# Patient Record
Sex: Female | Born: 1990 | Race: Black or African American | Hispanic: No | Marital: Married | Smoking: Current every day smoker
Health system: Southern US, Community
[De-identification: ages and names within clinical notes are randomized; demographics above are authoritative.]

## PROBLEM LIST (undated history)

## (undated) DIAGNOSIS — J45909 Unspecified asthma, uncomplicated: Secondary | ICD-10-CM

## (undated) DIAGNOSIS — D649 Anemia, unspecified: Secondary | ICD-10-CM

## (undated) DIAGNOSIS — E119 Type 2 diabetes mellitus without complications: Secondary | ICD-10-CM

## (undated) HISTORY — DX: Unspecified asthma, uncomplicated: J45.909

## (undated) HISTORY — PX: NO PAST SURGERIES: SHX2092

## (undated) HISTORY — DX: Anemia, unspecified: D64.9

---

## 2012-10-28 ENCOUNTER — Emergency Department: Payer: Self-pay | Admitting: Emergency Medicine

## 2013-01-07 ENCOUNTER — Emergency Department: Payer: Self-pay | Admitting: Emergency Medicine

## 2013-01-07 LAB — CBC
HCT: 28 % — ABNORMAL LOW (ref 35.0–47.0)
HGB: 8.8 g/dL — ABNORMAL LOW (ref 12.0–16.0)
MCH: 20 pg — ABNORMAL LOW (ref 26.0–34.0)
MCV: 64 fL — ABNORMAL LOW (ref 80–100)
Platelet: 493 10*3/uL — ABNORMAL HIGH (ref 150–440)
RBC: 4.4 10*6/uL (ref 3.80–5.20)
RDW: 21.3 % — ABNORMAL HIGH (ref 11.5–14.5)

## 2013-01-07 LAB — URINALYSIS, COMPLETE
Glucose,UR: NEGATIVE mg/dL (ref 0–75)
Hyaline Cast: 2
Ketone: NEGATIVE
Leukocyte Esterase: NEGATIVE
Nitrite: NEGATIVE
RBC,UR: <1 /HPF (ref 0–5)
Specific Gravity: 1.02 (ref 1.003–1.030)

## 2013-01-07 LAB — COMPREHENSIVE METABOLIC PANEL
BUN: 10 mg/dL (ref 7–18)
Calcium, Total: 9.1 mg/dL (ref 8.5–10.1)
Chloride: 103 mmol/L (ref 98–107)
Co2: 28 mmol/L (ref 21–32)
EGFR (African American): >60
EGFR (Non-African Amer.): >60
Osmolality: 270 (ref 275–301)
Potassium: 3.9 mmol/L (ref 3.5–5.1)
SGOT(AST): 23 U/L (ref 15–37)
SGPT (ALT): 22 U/L (ref 12–78)

## 2013-01-14 ENCOUNTER — Emergency Department: Payer: Self-pay | Admitting: Emergency Medicine

## 2013-01-22 ENCOUNTER — Emergency Department: Payer: Self-pay | Admitting: Internal Medicine

## 2013-02-19 ENCOUNTER — Emergency Department: Payer: Self-pay | Admitting: Emergency Medicine

## 2013-02-19 LAB — URINALYSIS, COMPLETE
Bacteria: NONE SEEN
Ketone: NEGATIVE
Leukocyte Esterase: NEGATIVE
Nitrite: NEGATIVE
Ph: 7 (ref 4.5–8.0)
Specific Gravity: 1.011 (ref 1.003–1.030)
WBC UR: <1 /HPF (ref 0–5)

## 2013-02-19 LAB — TSH: Thyroid Stimulating Horm: 3.36 u[IU]/mL

## 2013-02-19 LAB — CBC
HGB: 9 g/dL — ABNORMAL LOW (ref 12.0–16.0)
Platelet: 480 10*3/uL — ABNORMAL HIGH (ref 150–440)
WBC: 6 10*3/uL (ref 3.6–11.0)

## 2013-05-15 ENCOUNTER — Emergency Department: Payer: Self-pay | Admitting: Emergency Medicine

## 2013-09-25 ENCOUNTER — Emergency Department: Payer: Self-pay | Admitting: Emergency Medicine

## 2013-09-25 LAB — URINALYSIS, COMPLETE
Bilirubin,UR: NEGATIVE
GLUCOSE, UR: NEGATIVE mg/dL (ref 0–75)
Ketone: NEGATIVE
LEUKOCYTE ESTERASE: NEGATIVE
Nitrite: NEGATIVE
PH: 5 (ref 4.5–8.0)
Protein: NEGATIVE
SPECIFIC GRAVITY: 1.015 (ref 1.003–1.030)

## 2013-09-25 LAB — COMPREHENSIVE METABOLIC PANEL
ALBUMIN: 3.3 g/dL — AB (ref 3.4–5.0)
AST: 21 U/L (ref 15–37)
Alkaline Phosphatase: 95 U/L
Anion Gap: 3 — ABNORMAL LOW (ref 7–16)
BILIRUBIN TOTAL: 0.2 mg/dL (ref 0.2–1.0)
BUN: 7 mg/dL (ref 7–18)
Calcium, Total: 8.8 mg/dL (ref 8.5–10.1)
Chloride: 107 mmol/L (ref 98–107)
Co2: 29 mmol/L (ref 21–32)
Creatinine: 0.57 mg/dL — ABNORMAL LOW (ref 0.60–1.30)
EGFR (African American): >60
Glucose: 106 mg/dL — ABNORMAL HIGH (ref 65–99)
OSMOLALITY: 276 (ref 275–301)
Potassium: 3.8 mmol/L (ref 3.5–5.1)
SGPT (ALT): 23 U/L (ref 12–78)
Sodium: 139 mmol/L (ref 136–145)
Total Protein: 7.9 g/dL (ref 6.4–8.2)

## 2013-09-25 LAB — CBC WITH DIFFERENTIAL/PLATELET
BASOS ABS: 0 10*3/uL (ref 0.0–0.1)
Basophil %: 0.6 %
Eosinophil #: 0.7 10*3/uL (ref 0.0–0.7)
Eosinophil %: 11.5 %
HCT: 30.1 % — AB (ref 35.0–47.0)
HGB: 9.2 g/dL — AB (ref 12.0–16.0)
LYMPHS ABS: 1.7 10*3/uL (ref 1.0–3.6)
Lymphocyte %: 29 %
MCH: 20.3 pg — AB (ref 26.0–34.0)
MCHC: 30.8 g/dL — AB (ref 32.0–36.0)
MCV: 66 fL — AB (ref 80–100)
MONO ABS: 0.5 x10 3/mm (ref 0.2–0.9)
MONOS PCT: 8.4 %
NEUTROS ABS: 3 10*3/uL (ref 1.4–6.5)
Neutrophil %: 50.5 %
PLATELETS: 476 10*3/uL — AB (ref 150–440)
RBC: 4.54 10*6/uL (ref 3.80–5.20)
RDW: 20 % — ABNORMAL HIGH (ref 11.5–14.5)
WBC: 5.9 10*3/uL (ref 3.6–11.0)

## 2013-11-09 ENCOUNTER — Emergency Department: Payer: Self-pay | Admitting: Emergency Medicine

## 2013-11-09 LAB — CBC WITH DIFFERENTIAL/PLATELET
BASOS PCT: 0.7 %
Basophil #: 0.1 10*3/uL (ref 0.0–0.1)
EOS PCT: 7.1 %
Eosinophil #: 0.6 10*3/uL (ref 0.0–0.7)
HCT: 30.8 % — ABNORMAL LOW (ref 35.0–47.0)
HGB: 9.4 g/dL — ABNORMAL LOW (ref 12.0–16.0)
Lymphocyte #: 1.9 10*3/uL (ref 1.0–3.6)
Lymphocyte %: 22.3 %
MCH: 20 pg — ABNORMAL LOW (ref 26.0–34.0)
MCHC: 30.4 g/dL — ABNORMAL LOW (ref 32.0–36.0)
MCV: 66 fL — ABNORMAL LOW (ref 80–100)
MONOS PCT: 5.7 %
Monocyte #: 0.5 x10 3/mm (ref 0.2–0.9)
NEUTROS ABS: 5.5 10*3/uL (ref 1.4–6.5)
NEUTROS PCT: 64.2 %
Platelet: 471 10*3/uL — ABNORMAL HIGH (ref 150–440)
RBC: 4.66 10*6/uL (ref 3.80–5.20)
RDW: 21.1 % — AB (ref 11.5–14.5)
WBC: 8.5 10*3/uL (ref 3.6–11.0)

## 2013-11-09 LAB — BASIC METABOLIC PANEL
Anion Gap: 7 (ref 7–16)
BUN: 13 mg/dL (ref 7–18)
CALCIUM: 9.4 mg/dL (ref 8.5–10.1)
CHLORIDE: 105 mmol/L (ref 98–107)
CO2: 27 mmol/L (ref 21–32)
Creatinine: 0.88 mg/dL (ref 0.60–1.30)
EGFR (Non-African Amer.): >60
Glucose: 119 mg/dL — ABNORMAL HIGH (ref 65–99)
Osmolality: 279 (ref 275–301)
POTASSIUM: 4 mmol/L (ref 3.5–5.1)
Sodium: 139 mmol/L (ref 136–145)

## 2013-11-09 LAB — URINALYSIS, COMPLETE
BILIRUBIN, UR: NEGATIVE
Bacteria: NONE SEEN
Blood: NEGATIVE
Glucose,UR: NEGATIVE mg/dL (ref 0–75)
Hyaline Cast: 4
Ketone: NEGATIVE
LEUKOCYTE ESTERASE: NEGATIVE
NITRITE: NEGATIVE
PH: 6 (ref 4.5–8.0)
PROTEIN: NEGATIVE
RBC,UR: 1 /HPF (ref 0–5)
SPECIFIC GRAVITY: 1.029 (ref 1.003–1.030)
Squamous Epithelial: 2

## 2014-05-05 ENCOUNTER — Emergency Department: Payer: Self-pay | Admitting: Student

## 2014-05-05 LAB — COMPREHENSIVE METABOLIC PANEL
ALBUMIN: 3.1 g/dL — AB (ref 3.4–5.0)
Alkaline Phosphatase: 97 U/L
Anion Gap: 9 (ref 7–16)
BILIRUBIN TOTAL: 0.2 mg/dL (ref 0.2–1.0)
BUN: 8 mg/dL (ref 7–18)
CO2: 25 mmol/L (ref 21–32)
CREATININE: 0.83 mg/dL (ref 0.60–1.30)
Calcium, Total: 8.4 mg/dL — ABNORMAL LOW (ref 8.5–10.1)
Chloride: 106 mmol/L (ref 98–107)
EGFR (African American): >60
EGFR (Non-African Amer.): >60
Glucose: 117 mg/dL — ABNORMAL HIGH (ref 65–99)
Osmolality: 279 (ref 275–301)
Potassium: 4 mmol/L (ref 3.5–5.1)
SGOT(AST): 27 U/L (ref 15–37)
SGPT (ALT): 28 U/L
Sodium: 140 mmol/L (ref 136–145)
Total Protein: 7.6 g/dL (ref 6.4–8.2)

## 2014-05-05 LAB — CBC
HCT: 31.2 % — ABNORMAL LOW (ref 35.0–47.0)
HGB: 9.8 g/dL — ABNORMAL LOW (ref 12.0–16.0)
MCH: 21.6 pg — ABNORMAL LOW (ref 26.0–34.0)
MCHC: 31.3 g/dL — ABNORMAL LOW (ref 32.0–36.0)
MCV: 69 fL — AB (ref 80–100)
Platelet: 455 10*3/uL — ABNORMAL HIGH (ref 150–440)
RBC: 4.52 10*6/uL (ref 3.80–5.20)
RDW: 20.4 % — ABNORMAL HIGH (ref 11.5–14.5)
WBC: 8 10*3/uL (ref 3.6–11.0)

## 2014-05-05 LAB — TROPONIN I: Troponin-I: <0.02 ng/mL

## 2014-05-05 LAB — LIPASE, BLOOD: Lipase: 78 U/L (ref 73–393)

## 2014-05-05 LAB — HCG, QUANTITATIVE, PREGNANCY: Beta Hcg, Quant.: <1 m[IU]/mL — ABNORMAL LOW
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
## 2019-01-25 ENCOUNTER — Other Ambulatory Visit: Payer: Self-pay

## 2019-01-25 DIAGNOSIS — Z20822 Contact with and (suspected) exposure to covid-19: Secondary | ICD-10-CM

## 2019-01-26 LAB — NOVEL CORONAVIRUS, NAA: SARS-CoV-2, NAA: NOT DETECTED

## 2019-07-10 ENCOUNTER — Other Ambulatory Visit: Payer: Self-pay

## 2020-02-29 ENCOUNTER — Other Ambulatory Visit: Payer: Self-pay

## 2020-02-29 DIAGNOSIS — R112 Nausea with vomiting, unspecified: Secondary | ICD-10-CM | POA: Insufficient documentation

## 2020-02-29 DIAGNOSIS — R197 Diarrhea, unspecified: Secondary | ICD-10-CM | POA: Insufficient documentation

## 2020-02-29 DIAGNOSIS — R42 Dizziness and giddiness: Secondary | ICD-10-CM | POA: Insufficient documentation

## 2020-02-29 DIAGNOSIS — Z5321 Procedure and treatment not carried out due to patient leaving prior to being seen by health care provider: Secondary | ICD-10-CM | POA: Insufficient documentation

## 2020-02-29 LAB — CBC
HCT: 35.7 % — ABNORMAL LOW (ref 36.0–46.0)
Hemoglobin: 10.2 g/dL — ABNORMAL LOW (ref 12.0–15.0)
MCH: 20.4 pg — ABNORMAL LOW (ref 26.0–34.0)
MCHC: 28.6 g/dL — ABNORMAL LOW (ref 30.0–36.0)
MCV: 71.5 fL — ABNORMAL LOW (ref 80.0–100.0)
Platelets: 307 10*3/uL (ref 150–400)
RBC: 4.99 MIL/uL (ref 3.87–5.11)
RDW: 21.2 % — ABNORMAL HIGH (ref 11.5–15.5)
WBC: 8.2 10*3/uL (ref 4.0–10.5)
nRBC: 0 % (ref 0.0–0.2)

## 2020-02-29 LAB — COMPREHENSIVE METABOLIC PANEL
ALT: 24 U/L (ref 0–44)
AST: 19 U/L (ref 15–41)
Albumin: 3.6 g/dL (ref 3.5–5.0)
Alkaline Phosphatase: 69 U/L (ref 38–126)
Anion gap: 11 (ref 5–15)
BUN: 7 mg/dL (ref 6–20)
CO2: 25 mmol/L (ref 22–32)
Calcium: 8.5 mg/dL — ABNORMAL LOW (ref 8.9–10.3)
Chloride: 98 mmol/L (ref 98–111)
Creatinine, Ser: 0.6 mg/dL (ref 0.44–1.00)
GFR, Estimated: >60 mL/min (ref >60)
Glucose, Bld: 389 mg/dL — ABNORMAL HIGH (ref 70–99)
Potassium: 3.8 mmol/L (ref 3.5–5.1)
Sodium: 134 mmol/L — ABNORMAL LOW (ref 135–145)
Total Bilirubin: 0.3 mg/dL (ref 0.3–1.2)
Total Protein: 7.4 g/dL (ref 6.5–8.1)

## 2020-02-29 LAB — TROPONIN I (HIGH SENSITIVITY): Troponin I (High Sensitivity): 6 ng/L

## 2020-02-29 LAB — URINALYSIS, COMPLETE (UACMP) WITH MICROSCOPIC
Bacteria, UA: NONE SEEN
Bilirubin Urine: NEGATIVE
Glucose, UA: >=500 mg/dL — AB
Hgb urine dipstick: NEGATIVE
Ketones, ur: NEGATIVE mg/dL
Leukocytes,Ua: NEGATIVE
Nitrite: NEGATIVE
Protein, ur: NEGATIVE mg/dL
Specific Gravity, Urine: 1.032 — ABNORMAL HIGH (ref 1.005–1.030)
pH: 6 (ref 5.0–8.0)

## 2020-02-29 LAB — POCT PREGNANCY, URINE: Preg Test, Ur: NEGATIVE

## 2020-02-29 NOTE — ED Triage Notes (Signed)
Pt in with co n.v.d since yesterday. Vomited x 2 today with 2 episodes of diarrhea. States today was at work and started feeling dizzy and weak.

## 2020-03-01 ENCOUNTER — Emergency Department
Admission: EM | Admit: 2020-03-01 | Discharge: 2020-03-01 | Disposition: A | Discharge status: Home / Self Care | Payer: Self-pay | Attending: Emergency Medicine | Admitting: Emergency Medicine

## 2020-03-01 ENCOUNTER — Telehealth: Payer: Self-pay | Admitting: Emergency Medicine

## 2020-03-01 NOTE — Telephone Encounter (Signed)
Called patient due to lwot to inquire about condition and follow up plans. Left message.   

## 2020-03-01 NOTE — ED Notes (Signed)
No answer when called 

## 2020-03-01 NOTE — ED Notes (Signed)
No answer when called from lobby 

## 2020-10-03 ENCOUNTER — Other Ambulatory Visit: Payer: Self-pay

## 2020-10-03 ENCOUNTER — Emergency Department
Admission: EM | Admit: 2020-10-03 | Discharge: 2020-10-03 | Disposition: A | Discharge status: Home / Self Care | Payer: No Typology Code available for payment source | Attending: Emergency Medicine | Admitting: Emergency Medicine

## 2020-10-03 ENCOUNTER — Emergency Department: Payer: No Typology Code available for payment source

## 2020-10-03 DIAGNOSIS — Y92410 Unspecified street and highway as the place of occurrence of the external cause: Secondary | ICD-10-CM | POA: Insufficient documentation

## 2020-10-03 DIAGNOSIS — S40021A Contusion of right upper arm, initial encounter: Secondary | ICD-10-CM | POA: Diagnosis not present

## 2020-10-03 DIAGNOSIS — F17200 Nicotine dependence, unspecified, uncomplicated: Secondary | ICD-10-CM | POA: Diagnosis not present

## 2020-10-03 DIAGNOSIS — S61411A Laceration without foreign body of right hand, initial encounter: Secondary | ICD-10-CM | POA: Insufficient documentation

## 2020-10-03 DIAGNOSIS — Z23 Encounter for immunization: Secondary | ICD-10-CM | POA: Insufficient documentation

## 2020-10-03 DIAGNOSIS — S6991XA Unspecified injury of right wrist, hand and finger(s), initial encounter: Secondary | ICD-10-CM | POA: Diagnosis present

## 2020-10-03 MED ORDER — HYDROCODONE-ACETAMINOPHEN 5-325 MG PO TABS: 1.0000 | ORAL_TABLET | Freq: Four times a day (QID) | ORAL | 0 refills | Status: DC | PRN

## 2020-10-03 MED ORDER — TETANUS-DIPHTH-ACELL PERTUSSIS 5-2.5-18.5 LF-MCG/0.5 IM SUSY
0.5000 mL | PREFILLED_SYRINGE | Freq: Once | INTRAMUSCULAR | Status: AC
  Administered 2020-10-03: 0.5 mL via INTRAMUSCULAR
  Filled 2020-10-03: qty 0.5

## 2020-10-03 MED ORDER — LIDOCAINE HCL (PF) 1 % IJ SOLN
5.0000 mL | Freq: Once | INTRAMUSCULAR | Status: AC
  Administered 2020-10-03: 5 mL
  Filled 2020-10-03: qty 5
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
## 2020-10-03 NOTE — ED Triage Notes (Signed)
Laceration currently wrapped from ems and bleeding controlled. No active bleeding noted

## 2020-10-03 NOTE — Discharge Instructions (Addendum)
Follow-up with your primary care provider or urgent care for suture removal in approximately 10 to 12 days.  Keep the area clean and dry and watch for any signs of infection.  You may clean daily with mild soap and water and allow the area to dry completely before covering it again.  A prescription for pain medication was sent to your pharmacy.  Do not drive or operate machinery while taking this medication.  Return to the emergency department if any severe worsening of your symptoms or urgent concerns.  You may need to ice and elevate your arm to decrease swelling which will also help with pain.
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
## 2021-03-05 ENCOUNTER — Encounter: Payer: Self-pay | Admitting: Emergency Medicine

## 2021-03-05 ENCOUNTER — Emergency Department
Admission: EM | Admit: 2021-03-05 | Discharge: 2021-03-05 | Disposition: A | Discharge status: Home / Self Care | Payer: Self-pay | Attending: Emergency Medicine | Admitting: Emergency Medicine

## 2021-03-05 ENCOUNTER — Other Ambulatory Visit: Payer: Self-pay

## 2021-03-05 DIAGNOSIS — F17200 Nicotine dependence, unspecified, uncomplicated: Secondary | ICD-10-CM | POA: Insufficient documentation

## 2021-03-05 DIAGNOSIS — J069 Acute upper respiratory infection, unspecified: Secondary | ICD-10-CM

## 2021-03-05 DIAGNOSIS — Z20822 Contact with and (suspected) exposure to covid-19: Secondary | ICD-10-CM | POA: Insufficient documentation

## 2021-03-05 LAB — RESP PANEL BY RT-PCR (FLU A&B, COVID) ARPGX2
Influenza A by PCR: NEGATIVE
Influenza B by PCR: NEGATIVE
SARS Coronavirus 2 by RT PCR: NEGATIVE

## 2021-03-05 MED ORDER — BENZONATATE 100 MG PO CAPS: ORAL_CAPSULE | ORAL | 0 refills | Status: DC

## 2021-03-05 MED ORDER — PSEUDOEPH-BROMPHEN-DM 30-2-10 MG/5ML PO SYRP: 5.0000 mL | ORAL_SOLUTION | Freq: Four times a day (QID) | ORAL | 0 refills | Status: DC | PRN

## 2021-03-05 NOTE — Discharge Instructions (Signed)
You are being treated for a viral upper respiratory infection.  Your COVID/flu test is still pending at this time.  May follow your results on [facility] MyChart.  Take prescription meds as provided, follow with primary provider or local urgent care for ongoing symptoms.

## 2021-03-05 NOTE — ED Triage Notes (Signed)
Pt comes into the ED via POV c/o cough, sore throat, and body aches x 2 days.  Pt denies being around anyone with COVID that she is aware of.  Pt presents with even and unlabored respirations at this time.

## 2021-03-05 NOTE — ED Provider Notes (Signed)
Emergency Medicine Provider Triage Evaluation Note  Sandra Hays, a 30 y.o. female  was evaluated in triage.  Pt complains of cough and sore throat and body aches for the last 2 days.  Patient denies any known sick contacts but she does work at a [employer].  She denies any interim fevers, chills, or sweats.  Review of Systems  Positive: Cough, sore throat, body aches Negative: FCS  Physical Exam  BP 127/81   Pulse (!) 106   Temp 98.3 F (36.8 C) (Oral)   Ht 5\' 7"  (1.702 m)   Wt (!) 192.8 kg   LMP 03/05/2021   SpO2 97%   BMI 66.57 kg/m  Gen:   Awake, no distress  NAD Resp:  Normal effort CTA MSK:   Moves extremities without difficulty  Other:  CVS: RRR  Medical Decision Making  Medically screening exam initiated at 11:51 AM.  Appropriate orders placed.  Sandra Hays was informed that the remainder of the evaluation will be completed by another provider, this initial triage assessment does not replace that evaluation, and the importance of remaining in the ED until their evaluation is complete.  Patient ED evaluation for 2-day complaint of cough, sore throat and body aches.   Sandra Brawn, PA-C 03/05/21 1159    03/07/21, MD 03/05/21 1348

## 2021-03-19 NOTE — ED Provider Notes (Signed)
[facility] Emergency Department Provider Note ____________________________________________  Time seen: 1200  I have reviewed the triage vital signs and the nursing notes.  HISTORY  Chief Complaint  Generalized Body Aches, Sore Throat, and Cough   HPI Sandra Hays is a 30 y.o. female presents to the ED for evaluation of 2-day complaint of cough, congestion, and sore throat.  Patient also reports some generalized body aches.  She denies any sick contacts.  History reviewed. No pertinent past medical history.  There are no problems to display for this patient.   History reviewed. No pertinent surgical history.  Prior to Admission medications   Medication Sig Start Date End Date Taking? Authorizing Provider  benzonatate (TESSALON PERLES) 100 MG capsule Take 1-2 tabs TID prn cough 03/05/21  Yes Linzey Ramser, Charlesetta Ivory, PA-C  brompheniramine-pseudoephedrine-DM 30-2-10 MG/5ML syrup Take 5 mLs by mouth 4 (four) times daily as needed. 03/05/21  Yes Inmer Nix, Charlesetta Ivory, PA-C  HYDROcodone-acetaminophen (NORCO/VICODIN) 5-325 MG tablet Take 1 tablet by mouth every 6 (six) hours as needed for moderate pain. 10/03/20 10/03/21  Tommi Rumps, PA-C    Allergies Patient has no known allergies.  History reviewed. No pertinent family history.  Social History Social History   Tobacco Use   Smoking status: Every Day   Smokeless tobacco: Never  Substance Use Topics   Alcohol use: Not Currently   Drug use: Never    Review of Systems  Constitutional: Negative for fever. Eyes: Negative for visual changes. ENT: Positive for sore throat. Cardiovascular: Negative for chest pain. Respiratory: Negative for shortness of breath. Repots cough Gastrointestinal: Negative for abdominal pain, vomiting and diarrhea. Genitourinary: Negative for dysuria. Musculoskeletal: Negative for back pain.  Generalized body aches Skin: Negative for rash. Neurological: Negative for  headaches, focal weakness or numbness. ____________________________________________  PHYSICAL EXAM:  VITAL SIGNS: ED Triage Vitals  Enc Vitals Group     BP 03/05/21 1146 127/81     Pulse Rate 03/05/21 1146 (!) 106     Resp 03/05/21 1309 17     Temp 03/05/21 1146 98.3 F (36.8 C)     Temp Source 03/05/21 1146 Oral     SpO2 03/05/21 1146 97 %     Weight 03/05/21 1141 (!) 425 lb 0.8 oz (192.8 kg)     Height 03/05/21 1141 5\' 7"  (1.702 m)     Head Circumference --      Peak Flow --      Pain Score 03/05/21 1141 8     Pain Loc --      Pain Edu? --      Excl. in GC? --     Constitutional: Alert and oriented. Well appearing and in no distress. Head: Normocephalic and atraumatic. Eyes: Conjunctivae are normal. PERRL. Normal extraocular movements Ears: Canals clear. TMs intact bilaterally. Nose: No congestion/rhinorrhea/epistaxis. Mouth/Throat: Mucous membranes are moist.  Uvula is midline and tonsils are flat.  No oropharyngeal lesions are appreciated. Neck: Supple. No thyromegaly. Hematological/Lymphatic/Immunological: No cervical lymphadenopathy. Cardiovascular: Normal rate, regular rhythm. Normal distal pulses. Respiratory: Normal respiratory effort. No wheezes/rales/rhonchi. Gastrointestinal: Soft and nontender. No distention. Musculoskeletal: Nontender with normal range of motion in all extremities.  Neurologic:  Normal gait without ataxia. Normal speech and language. No gross focal neurologic deficits are appreciated. Skin:  Skin is warm, dry and intact. No rash noted. ____________________________________________    {LABS (pertinent positives/negatives)  Labs Reviewed  RESP PANEL BY RT-PCR (FLU A&B, COVID) ARPGX2  ____________________________________________  {EKG  ____________________________________________  RADIOLOGY Official radiology report(s): No results  found. ____________________________________________  PROCEDURES   Procedures ____________________________________________   INITIAL IMPRESSION / ASSESSMENT AND PLAN / ED COURSE  As part of my medical decision making, I reviewed the following data within the electronic MEDICAL RECORD NUMBER Labs reviewed pending and Notes from prior ED visits   DDX: Covid, influenza, pharyngitis, viral URI  Patient ED evaluation of several days of cough, congestion, and associated sore throat.  She is evaluated for complaints and, had a clinical picture consistent with likely viral etiology including COVID and or flu.  Viral panel screen is pending at the time of this disposition.  Patient will await test results and treat symptoms with prescription medications and over-the-counter medications.  Work note is provided as requested.   Sandra Hays was evaluated in Emergency Department on 03/19/2021 for the symptoms described in the history of present illness. She was evaluated in the context of the global COVID-19 pandemic, which necessitated consideration that the patient might be at risk for infection with the SARS-CoV-2 virus that causes COVID-19. Institutional protocols and algorithms that pertain to the evaluation of patients at risk for COVID-19 are in a state of rapid change based on information released by regulatory bodies including the CDC and federal and state organizations. These policies and algorithms were followed during the patient's care in the ED. ____________________________________________  FINAL CLINICAL IMPRESSION(S) / ED DIAGNOSES  Final diagnoses:  Viral URI with cough      Tyrah Broers, Charlesetta Ivory, PA-C 03/19/21 1606    Merwyn Katos, MD 03/20/21 1539

## 2021-06-23 ENCOUNTER — Ambulatory Visit (INDEPENDENT_AMBULATORY_CARE_PROVIDER_SITE_OTHER): Payer: BC Managed Care – PPO | Admitting: Internal Medicine

## 2021-06-23 ENCOUNTER — Encounter: Payer: Self-pay | Admitting: Internal Medicine

## 2021-06-23 ENCOUNTER — Other Ambulatory Visit: Payer: Self-pay

## 2021-06-23 VITALS — BP 135/89 | HR 88 | Temp 96.2°F | Ht 67.0 in | Wt 364.0 lb

## 2021-06-23 DIAGNOSIS — R0683 Snoring: Secondary | ICD-10-CM | POA: Diagnosis not present

## 2021-06-23 DIAGNOSIS — I83813 Varicose veins of bilateral lower extremities with pain: Secondary | ICD-10-CM | POA: Insufficient documentation

## 2021-06-23 DIAGNOSIS — R0681 Apnea, not elsewhere classified: Secondary | ICD-10-CM

## 2021-06-23 DIAGNOSIS — G4719 Other hypersomnia: Secondary | ICD-10-CM

## 2021-06-23 DIAGNOSIS — E1165 Type 2 diabetes mellitus with hyperglycemia: Secondary | ICD-10-CM | POA: Diagnosis not present

## 2021-06-23 DIAGNOSIS — D509 Iron deficiency anemia, unspecified: Secondary | ICD-10-CM | POA: Insufficient documentation

## 2021-06-23 DIAGNOSIS — D500 Iron deficiency anemia secondary to blood loss (chronic): Secondary | ICD-10-CM

## 2021-06-23 NOTE — Assessment & Plan Note (Signed)
CBC today Can discuss consideration of hormonal therapy versus oral iron based on lab results

## 2021-06-23 NOTE — Assessment & Plan Note (Signed)
A1c and urine microalbumin today Encouraged her to consume a low-carb diet and exercise for weight loss We will discuss medication therapy pending A1c She has an upcoming eye appointment scheduled, advised her to have them send me a copy Encourage routine foot exams She declines flu, Pneumovax and COVID at this time

## 2021-06-23 NOTE — Assessment & Plan Note (Signed)
Encourage diet and exercise for weight loss 

## 2021-06-23 NOTE — Patient Instructions (Signed)

## 2021-06-23 NOTE — Progress Notes (Signed)
HPI  Pt presents to the clinic today to establish care and for the management of the conditions listed below.  She has not had a PCP in a few years.  Diabetes: Diagnosed a few years ago.  She is not currently taking Metformin. She does not check her sugars. She has not seen an eye doctor in the last year.  She does not check her feet routinely.  She declines flu, Pneumovax and COVID vaccines  Iron Deficiency Anemia: Her last H/H was 10.2/35.7, 02/2020.  She reports very heavy periods.  She is not currently on hormonal therapy for this.  She also reports pain in her bilateral legs, L >R.  She is not sure what is causing this.  She denies numbness, tingling or weakness of the lower extremities.  She has not tried anything OTC for this.  She also has concerns about sleep apnea.  She reports she snores, has had witnessed apnea spells and is excessively tired throughout the day.  She has never been diagnosed with sleep apnea in the past.  No past medical history on file.  Current Outpatient Medications  Medication Sig Dispense Refill   albuterol (VENTOLIN HFA) 108 (90 Base) MCG/ACT inhaler Inhale into the lungs. (Patient not taking: Reported on 06/23/2021)     metFORMIN (GLUCOPHAGE) 500 MG tablet Take by mouth. (Patient not taking: Reported on 06/23/2021)     No current facility-administered medications for this visit.    No Known Allergies  Family History  Problem Relation Age of Onset   Diabetes Mother    Parkinson's disease Mother    Irregular heart beat Mother    Healthy Father    Healthy Sister    Diabetes Paternal Grandmother    Breast cancer Other    Colon cancer Neg Hx     Social History   Socioeconomic History   Marital status: Single    Spouse name: Not on file   Number of children: Not on file   Years of education: Not on file   Highest education level: Not on file  Occupational History   Not on file  Tobacco Use   Smoking status: Every Day   Smokeless tobacco: Never   Vaping Use   Vaping Use: Never used  Substance and Sexual Activity   Alcohol use: Not Currently   Drug use: Never   Sexual activity: Not on file  Other Topics Concern   Not on file  Social History Narrative   Not on file   Social Determinants of Health   Financial Resource Strain: Not on file  Food Insecurity: Not on file  Transportation Needs: Not on file  Physical Activity: Not on file  Stress: Not on file  Social Connections: Not on file  Intimate Partner Violence: Not on file    ROS:  Constitutional: Patient reports excessive daytime fatigue.  Denies fever, malaise, headache or abrupt weight changes.  HEENT: Denies eye pain, eye redness, ear pain, ringing in the ears, wax buildup, runny nose, nasal congestion, bloody nose, or sore throat. Respiratory: Denies difficulty breathing, shortness of breath, cough or sputum production.   Cardiovascular: Denies chest pain, chest tightness, palpitations or swelling in the hands or feet.  Gastrointestinal: Denies abdominal pain, bloating, constipation, diarrhea or blood in the stool.  GU: Patient reports heavy menses.  Denies frequency, urgency, pain with urination, blood in urine, odor or discharge. Musculoskeletal: Patient reports bilateral leg pain.  Denies decrease in range of motion, difficulty with gait, muscle pain or joint  pain and swelling.  Skin: Denies redness, rashes, lesions or ulcercations.  Neurological: Denies dizziness, difficulty with memory, difficulty with speech or problems with balance and coordination.  Psych: Denies anxiety, depression, SI/HI.  No other specific complaints in a complete review of systems (except as listed in HPI above).  PE:  BP (!) 135/93 (BP Location: Right Wrist, Patient Position: Sitting, Cuff Size: Normal)    Pulse (!) 8    Temp (!) 96.2 F (35.7 C) (Temporal)    Ht 5\' 7"  (1.702 m)    Wt (!) 364 lb (165.1 kg)    SpO2 100%    BMI 57.01 kg/m  Wt Readings from Last 3 Encounters:   06/23/21 (!) 364 lb (165.1 kg)  03/05/21 (!) 425 lb 0.8 oz (192.8 kg)  10/03/20 (!) 425 lb (192.8 kg)    General: Appears her stated age, obese, in NAD. HEENT: Head: normal shape and size; Eyes: sclera white and EOMs intact;  Neck: Neck supple, trachea midline. No masses, lumps or thyromegaly present.  Cardiovascular: Normal rate and rhythm. S1,S2 noted.  No murmur, rubs or gallops noted. No JVD or BLE edema.  Varicose veins noted of BLE, L >R. Pulmonary/Chest: Normal effort and positive vesicular breath sounds. No respiratory distress. No wheezes, rales or ronchi noted.  Musculoskeletal:  No difficulty with gait.  Neurological: Alert and oriented.  Psychiatric: Mood and affect normal. Behavior is normal. Judgment and thought content normal.     BMET    Component Value Date/Time   NA 134 (L) 02/29/2020 2154   NA 140 05/05/2014 1100   K 3.8 02/29/2020 2154   K 4.0 05/05/2014 1100   CL 98 02/29/2020 2154   CL 106 05/05/2014 1100   CO2 25 02/29/2020 2154   CO2 25 05/05/2014 1100   GLUCOSE 389 (H) 02/29/2020 2154   GLUCOSE 117 (H) 05/05/2014 1100   BUN 7 02/29/2020 2154   BUN 8 05/05/2014 1100   CREATININE 0.60 02/29/2020 2154   CREATININE 0.83 05/05/2014 1100   CALCIUM 8.5 (L) 02/29/2020 2154   CALCIUM 8.4 (L) 05/05/2014 1100   GFRNONAA >60 02/29/2020 2154   GFRNONAA >60 05/05/2014 1100   GFRNONAA >60 11/09/2013 1854   GFRAA >60 05/05/2014 1100   GFRAA >60 11/09/2013 1854    Lipid Panel  No results found for: CHOL, TRIG, HDL, CHOLHDL, VLDL, LDLCALC  CBC    Component Value Date/Time   WBC 8.2 02/29/2020 2154   RBC 4.99 02/29/2020 2154   HGB 10.2 (L) 02/29/2020 2154   HGB 9.8 (L) 05/05/2014 1100   HCT 35.7 (L) 02/29/2020 2154   HCT 31.2 (L) 05/05/2014 1100   PLT 307 02/29/2020 2154   PLT 455 (H) 05/05/2014 1100   MCV 71.5 (L) 02/29/2020 2154   MCV 69 (L) 05/05/2014 1100   MCH 20.4 (L) 02/29/2020 2154   MCHC 28.6 (L) 02/29/2020 2154   RDW 21.2 (H) 02/29/2020  2154   RDW 20.4 (H) 05/05/2014 1100   LYMPHSABS 1.9 11/09/2013 1854   MONOABS 0.5 11/09/2013 1854   EOSABS 0.6 11/09/2013 1854   BASOSABS 0.1 11/09/2013 1854    Hgb A1C No results found for: HGBA1C   Assessment and Plan:  Excessive Daytime Sleepiness, Witnessed Apnea and Snoring:  Results of the Epworth flowsheet 06/23/2021  Sitting and reading 3  Watching TV 3  Sitting, inactive in a public place (e.g. a theatre or a meeting) 1  As a passenger in a car for an hour without a break 3  Lying down to rest in the afternoon when circumstances permit 2  Sitting and talking to someone 1  Sitting quietly after a lunch without alcohol 3  In a car, while stopped for a few minutes in traffic 1  Total score 17   Neck Size: 17.5in                    44cm Referral to pulmonology for consideration of sleep study  We will follow-up after labs with further recommendation and treatment plan Nicki Reaper, NP This visit occurred during the SARS-CoV-2 public health emergency.  Safety protocols were in place, including screening questions prior to the visit, additional usage of staff PPE, and extensive cleaning of exam room while observing appropriate contact time as indicated for disinfecting solutions.

## 2021-06-23 NOTE — Assessment & Plan Note (Signed)
Referral to vascular for further evaluation and treatment

## 2021-06-24 LAB — COMPLETE METABOLIC PANEL WITH GFR
AG Ratio: 1.3 (calc) (ref 1.0–2.5)
ALT: 14 U/L (ref 6–29)
AST: 14 U/L (ref 10–30)
Albumin: 4.1 g/dL (ref 3.6–5.1)
Alkaline phosphatase (APISO): 76 U/L (ref 31–125)
BUN: 7 mg/dL (ref 7–25)
CO2: 31 mmol/L (ref 20–32)
Calcium: 9.2 mg/dL (ref 8.6–10.2)
Chloride: 101 mmol/L (ref 98–110)
Creat: 0.6 mg/dL (ref 0.50–0.97)
Globulin: 3.2 g/dL (calc) (ref 1.9–3.7)
Glucose, Bld: 264 mg/dL — ABNORMAL HIGH (ref 65–99)
Potassium: 4.3 mmol/L (ref 3.5–5.3)
Sodium: 137 mmol/L (ref 135–146)
Total Bilirubin: 0.3 mg/dL (ref 0.2–1.2)
Total Protein: 7.3 g/dL (ref 6.1–8.1)
eGFR: 122 mL/min/{1.73_m2} (ref >=60)

## 2021-06-24 LAB — MICROALBUMIN / CREATININE URINE RATIO
Creatinine, Urine: 217 mg/dL (ref 20–275)
Microalb Creat Ratio: 14 mcg/mg creat (ref <30)
Microalb, Ur: 3 mg/dL

## 2021-06-24 LAB — CBC
HCT: 34.6 % — ABNORMAL LOW (ref 35.0–45.0)
Hemoglobin: 10.1 g/dL — ABNORMAL LOW (ref 11.7–15.5)
MCH: 21 pg — ABNORMAL LOW (ref 27.0–33.0)
MCHC: 29.2 g/dL — ABNORMAL LOW (ref 32.0–36.0)
MCV: 71.8 fL — ABNORMAL LOW (ref 80.0–100.0)
MPV: 10 fL (ref 7.5–12.5)
Platelets: 498 10*3/uL — ABNORMAL HIGH (ref 140–400)
RBC: 4.82 10*6/uL (ref 3.80–5.10)
RDW: 17.4 % — ABNORMAL HIGH (ref 11.0–15.0)
WBC: 6.9 10*3/uL (ref 3.8–10.8)

## 2021-06-24 LAB — LIPID PANEL
Cholesterol: 157 mg/dL (ref <200)
HDL: 42 mg/dL — ABNORMAL LOW (ref >=50)
LDL Cholesterol (Calc): 94 mg/dL (calc)
Non-HDL Cholesterol (Calc): 115 mg/dL (calc) (ref <130)
Total CHOL/HDL Ratio: 3.7 (calc) (ref <5.0)
Triglycerides: 115 mg/dL (ref <150)

## 2021-06-24 LAB — TSH: TSH: 1.4 mIU/L

## 2021-06-24 LAB — HEMOGLOBIN A1C
Hgb A1c MFr Bld: 11.1 % of total Hgb — ABNORMAL HIGH (ref <5.7)
Mean Plasma Glucose: 272 mg/dL
eAG (mmol/L): 15.1 mmol/L

## 2021-06-30 ENCOUNTER — Emergency Department
Admission: EM | Admit: 2021-06-30 | Discharge: 2021-06-30 | Disposition: A | Discharge status: Home / Self Care | Payer: PRIVATE HEALTH INSURANCE | Attending: Emergency Medicine | Admitting: Emergency Medicine

## 2021-06-30 ENCOUNTER — Other Ambulatory Visit: Payer: Self-pay

## 2021-06-30 DIAGNOSIS — M545 Low back pain, unspecified: Secondary | ICD-10-CM

## 2021-06-30 DIAGNOSIS — M5440 Lumbago with sciatica, unspecified side: Secondary | ICD-10-CM | POA: Insufficient documentation

## 2021-06-30 MED ORDER — IBUPROFEN 400 MG PO TABS
400.0000 mg | ORAL_TABLET | Freq: Once | ORAL | Status: AC
  Administered 2021-06-30: 400 mg via ORAL
  Filled 2021-06-30: qty 1

## 2021-06-30 MED ORDER — LIDOCAINE 5 % EX PTCH
1.0000 | MEDICATED_PATCH | CUTANEOUS | Status: DC
  Administered 2021-06-30: 1 via TRANSDERMAL
  Filled 2021-06-30: qty 1

## 2021-06-30 MED ORDER — ACETAMINOPHEN 500 MG PO TABS
1000.0000 mg | ORAL_TABLET | Freq: Once | ORAL | Status: AC
  Administered 2021-06-30: 1000 mg via ORAL
  Filled 2021-06-30: qty 2

## 2021-06-30 NOTE — ED Triage Notes (Addendum)
Pt comes with c/o LL back pain from injury while at work today. Pt wishes to file for workers comp. Pt works at [employer]

## 2021-06-30 NOTE — Discharge Instructions (Signed)
I suspect you have sustained a muscle strain.  I recommend 1 g of Tylenol and 400 mg of ibuprofen every 6 hours.  If you find the lidocaine patch helped you may get this over-the-counter and use 1 patch every 24 hours.  Please return to the emergency room for any new symptoms or acute worsening of your current pain.  In addition if you develop any incontinence or new lower extremity weakness numbness or tingling.

## 2021-06-30 NOTE — ED Provider Notes (Signed)
[facility] Provider Note    Event Date/Time   First MD Initiated Contact with Patient 06/30/21 [phone]     (approximate)   History   Back Pain   HPI  Sandra Hays is a 31 y.o. female without significant past medical history who presents accompanied by wife for assessment of some acute right-sided low back pain that she states she experienced suddenly while doing some pulling at work.  She has not had any lower extremity weakness numbness tingling or pain.  No incontinence.  No midline pain or left-sided pain.  No other recent sick symptoms or injuries or falls.  She denies any history of IV drug use, steroids, malignancy or any other sick symptoms.      Physical Exam  Triage Vital Signs: ED Triage Vitals  Enc Vitals Group     BP 06/30/21 0933 105/73     Pulse Rate 06/30/21 0933 87     Resp 06/30/21 0933 15     Temp 06/30/21 0933 97.9 F (36.6 C)     Temp Source 06/30/21 0933 Oral     SpO2 06/30/21 0933 100 %     Weight --      Height --      Head Circumference --      Peak Flow --      Pain Score 06/30/21 0912 10     Pain Loc --      Pain Edu? --      Excl. in GC? --     Most recent vital signs: Vitals:   06/30/21 0933  BP: 105/73  Pulse: 87  Resp: 15  Temp: 97.9 F (36.6 C)  SpO2: 100%    General: Awake, no distress.  Patient is obese. CV:  Good peripheral perfusion.  Resp:  Normal effort.  Abd:  No distention.  Other:  Some tenderness over the right-sided paralumbar muscles without any overlying skin changes.  No point tenderness over the C/T/L-spine.  Patient has symmetric strength in the bilateral lower extremities.  Sensations intact light touch throughout.  2+ bilateral PT pulses.   ED Results / Procedures / Treatments  Labs (all labs ordered are listed, but only abnormal results are displayed) Labs Reviewed - No data to display   EKG   RADIOLOGY    PROCEDURES:   MEDICATIONS ORDERED IN ED: Medications   lidocaine (LIDODERM) 5 % 1 patch (1 patch Transdermal Patch Applied 06/30/21 1021)  ibuprofen (ADVIL) tablet 400 mg (400 mg Oral Given 06/30/21 1020)  acetaminophen (TYLENOL) tablet 1,000 mg (1,000 mg Oral Given 06/30/21 1019)     IMPRESSION / MDM / ASSESSMENT AND PLAN / ED COURSE  I reviewed the triage vital signs and the nursing notes.                              Differential diagnosis includes, but is not limited to muscle strain with lower suspicion at this time based on patient's history and exam of acute cord compression, pathologic fracture or acute infectious process.  She is neurovascular intact with stable vitals and able to ambulate.  We will give some Tylenol ibuprofen as well as lidocaine patch.  Recommended close outpatient follow-up for any persistent symptoms.  Discharged in stable condition.  No red flag symptoms to necessitate additional imaging or work-up at this time.      FINAL CLINICAL IMPRESSION(S) / ED DIAGNOSES   Final diagnoses:  Acute  low back pain, unspecified back pain laterality, unspecified whether sciatica present     Rx / DC Orders   ED Discharge Orders     None        Note:  This document was prepared using Dragon voice recognition software and may include unintentional dictation errors.   Gilles Chiquito, MD 06/30/21 [phone]

## 2021-07-01 ENCOUNTER — Ambulatory Visit: Payer: BC Managed Care – PPO | Admitting: Internal Medicine

## 2021-07-01 ENCOUNTER — Encounter: Payer: Self-pay | Admitting: Internal Medicine

## 2021-07-01 VITALS — BP 125/70 | HR 99 | Resp 17 | Ht 67.0 in | Wt 365.0 lb

## 2021-07-01 DIAGNOSIS — E1142 Type 2 diabetes mellitus with diabetic polyneuropathy: Secondary | ICD-10-CM | POA: Diagnosis not present

## 2021-07-01 DIAGNOSIS — M545 Low back pain, unspecified: Secondary | ICD-10-CM

## 2021-07-01 MED ORDER — GLIPIZIDE 10 MG PO TABS: 10.0000 mg | ORAL_TABLET | Freq: Two times a day (BID) | ORAL | 2 refills | Status: DC

## 2021-07-01 MED ORDER — METHOCARBAMOL 500 MG PO TABS: 500.0000 mg | ORAL_TABLET | Freq: Three times a day (TID) | ORAL | 0 refills | Status: DC | PRN

## 2021-07-01 MED ORDER — IBUPROFEN 800 MG PO TABS: 800.0000 mg | ORAL_TABLET | Freq: Three times a day (TID) | ORAL | 0 refills | Status: DC | PRN

## 2021-07-01 NOTE — Progress Notes (Signed)
Subjective:    Patient ID: Sandra Hays, female    DOB: August 22, 1990, 31 y.o.   MRN: [id number]  HPI  Patient presents to clinic today to follow-up recent labs.  Her recent A1c was 11.1%.  This is a new diagnosis of diabetes for her.  She reports she is not very familiar with diabetes or what causes it.  She denies blurred vision, increased thirst, urinary frequency, unhealing wounds but does have numbness and tingling in her upper and lower extremities.  She also reports she recently went to ER for acute low back pain.  She reports this occurred from a work injury.  She describes the pain as sore and achy but can be sharp and stabbing at times.  The pain does not radiate down her legs.  She reports they did not take x-rays.  She was given Tylenol, ibuprofen and a lidocaine patch in the ER but was not discharged with any medications.  She reports she is in so much pain that she is unable to work at this time.  Review of Systems  No past medical history on file.  Current Outpatient Medications  Medication Sig Dispense Refill   metFORMIN (GLUCOPHAGE) 500 MG tablet Take by mouth. (Patient not taking: Reported on 06/23/2021)     No current facility-administered medications for this visit.    No Known Allergies  Family History  Problem Relation Age of Onset   Diabetes Mother    Parkinson's disease Mother    Irregular heart beat Mother    Healthy Father    Healthy Sister    Diabetes Paternal Grandmother    Breast cancer Other    Colon cancer Neg Hx     Social History   Socioeconomic History   Marital status: Married    Spouse name: Not on file   Number of children: Not on file   Years of education: Not on file   Highest education level: Not on file  Occupational History   Not on file  Tobacco Use   Smoking status: Every Day   Smokeless tobacco: Never  Vaping Use   Vaping Use: Never used  Substance and Sexual Activity   Alcohol use: Not Currently   Drug use: Never    Sexual activity: Not on file  Other Topics Concern   Not on file  Social History Narrative   Not on file   Social Determinants of Health   Financial Resource Strain: Not on file  Food Insecurity: Not on file  Transportation Needs: Not on file  Physical Activity: Not on file  Stress: Not on file  Social Connections: Not on file  Intimate Partner Violence: Not on file     Constitutional: Denies fever, malaise, fatigue, headache or abrupt weight changes.  HEENT: Denies eye pain, eye redness, ear pain, ringing in the ears, wax buildup, runny nose, nasal congestion, bloody nose, or sore throat. Respiratory: Denies difficulty breathing, shortness of breath, cough or sputum production.   Cardiovascular: Denies chest pain, chest tightness, palpitations or swelling in the hands or feet.  Gastrointestinal: Denies abdominal pain, bloating, constipation, diarrhea or blood in the stool.  GU: Denies urgency, frequency, pain with urination, burning sensation, blood in urine, odor or discharge. Musculoskeletal: Patient reports low back pain.  Denies decrease in range of motion, difficulty with gait, or joint swelling.  Skin: Denies redness, rashes, lesions or ulcercations.  Neurological: Patient reports numbness and tingling in her upper and lower extremities.  Denies dizziness, difficulty with memory,  difficulty with speech or problems with balance and coordination.    No other specific complaints in a complete review of systems (except as listed in HPI above).     Objective:   Physical Exam   BP 125/70 (BP Location: Left Arm, Patient Position: Sitting, Cuff Size: Large)    Pulse 99    Resp 17    Ht 5\' 7"  (1.702 m)    Wt (!) 365 lb (165.6 kg)    SpO2 100%    BMI 57.17 kg/m  Wt Readings from Last 3 Encounters:  07/01/21 (!) 365 lb (165.6 kg)  06/30/21 (!) 363 lb 12.1 oz (165 kg)  06/23/21 (!) 364 lb (165.1 kg)    General: Appears her stated age, obese, in NAD. Skin: Warm, dry and intact.  No ulcerations noted. HEENT: Head: normal shape and size; Eyes: EOMs intact;  Cardiovascular: Normal rate and rhythm. S1,S2 noted.  No murmur, rubs or gallops noted. No JVD or BLE edema.  Pulmonary/Chest: Normal effort and positive vesicular breath sounds. No respiratory distress. No wheezes, rales or ronchi noted.  Musculoskeletal: Decreased flexion and extension of the spine.  Normal rotation and lateral bending.  She has obvious difficulty getting from a sitting to a standing position.  Pain with palpation over the lumbar spine and bilateral paralumbar muscles.  Strength 5/5 BLE.  No difficulty with gait.  Neurological: Alert and oriented.  Sensation intact to BLE.   BMET    Component Value Date/Time   NA 137 06/23/2021 1027   NA 140 05/05/2014 1100   K 4.3 06/23/2021 1027   K 4.0 05/05/2014 1100   CL 101 06/23/2021 1027   CL 106 05/05/2014 1100   CO2 31 06/23/2021 1027   CO2 25 05/05/2014 1100   GLUCOSE 264 (H) 06/23/2021 1027   GLUCOSE 117 (H) 05/05/2014 1100   BUN 7 06/23/2021 1027   BUN 8 05/05/2014 1100   CREATININE 0.60 06/23/2021 1027   CALCIUM 9.2 06/23/2021 1027   CALCIUM 8.4 (L) 05/05/2014 1100   GFRNONAA >60 02/29/2020 2154   GFRNONAA >60 05/05/2014 1100   GFRNONAA >60 11/09/2013 1854   GFRAA >60 05/05/2014 1100   GFRAA >60 11/09/2013 1854    Lipid Panel     Component Value Date/Time   CHOL 157 06/23/2021 1027   TRIG 115 06/23/2021 1027   HDL 42 (L) 06/23/2021 1027   CHOLHDL 3.7 06/23/2021 1027   LDLCALC 94 06/23/2021 1027    CBC    Component Value Date/Time   WBC 6.9 06/23/2021 1027   RBC 4.82 06/23/2021 1027   HGB 10.1 (L) 06/23/2021 1027   HGB 9.8 (L) 05/05/2014 1100   HCT 34.6 (L) 06/23/2021 1027   HCT 31.2 (L) 05/05/2014 1100   PLT 498 (H) 06/23/2021 1027   PLT 455 (H) 05/05/2014 1100   MCV 71.8 (L) 06/23/2021 1027   MCV 69 (L) 05/05/2014 1100   MCH 21.0 (L) 06/23/2021 1027   MCHC 29.2 (L) 06/23/2021 1027   RDW 17.4 (H) 06/23/2021 1027    RDW 20.4 (H) 05/05/2014 1100   LYMPHSABS 1.9 11/09/2013 1854   MONOABS 0.5 11/09/2013 1854   EOSABS 0.6 11/09/2013 1854   BASOSABS 0.1 11/09/2013 1854    Hgb A1C Lab Results  Component Value Date   HGBA1C 11.1 (H) 06/23/2021           Assessment & Plan:   ER follow-up for Low Back Pain:  She has not completed her workman's comp report, advised her  to do this Advised her I could get an x-ray of her lumbar spine today however it would likely not be covered under Workmen's Comp. Rx for Ibuprofen 800 mg every 8 hours as needed with food Rx for Methocarbamol 500 mg every 8 as needed-sedation caution given Back exercises given  RTC in 3 months, Follow-up chronic conditions  Nicki Reaper, NP This visit occurred during the SARS-CoV-2 public health emergency.  Safety protocols were in place, including screening questions prior to the visit, additional usage of staff PPE, and extensive cleaning of exam room while observing appropriate contact time as indicated for disinfecting solutions.

## 2021-07-02 ENCOUNTER — Telehealth: Payer: Self-pay

## 2021-07-02 NOTE — Telephone Encounter (Signed)
I called patient to follow up on the status of her ophthalmologist screening appointment. She informed me that she has an appointment scheduled on 07/08/2021. She will have Ophthalmologist office to fax over the results to our office.

## 2021-07-03 NOTE — Patient Instructions (Signed)

## 2021-07-03 NOTE — Assessment & Plan Note (Signed)
Encourage diet and exercise for weight loss 

## 2021-07-03 NOTE — Assessment & Plan Note (Signed)
New onset Discussed diabetes and standards of medical care We will start Glipizide 10 mg twice daily Encouraged low-carb diet and exercise for weight loss Referral to diabetes education and nutrition Encourage yearly eye exam Encourage routine foot exams  she declines flu or Pneumovax today Encouraged her to get her COVID-vaccine

## 2021-07-07 ENCOUNTER — Encounter: Payer: Self-pay | Admitting: Internal Medicine

## 2021-07-31 ENCOUNTER — Telehealth: Payer: Self-pay

## 2021-07-31 NOTE — Telephone Encounter (Signed)
ATC patient in regards to her upcoming appt. Would like to know if she has ever had sleep study before. Will try again later.  ?

## 2021-08-01 ENCOUNTER — Institutional Professional Consult (permissible substitution): Payer: Self-pay | Admitting: Adult Health

## 2021-08-20 ENCOUNTER — Other Ambulatory Visit: Payer: Self-pay

## 2021-08-20 ENCOUNTER — Emergency Department
Admission: EM | Admit: 2021-08-20 | Discharge: 2021-08-20 | Disposition: A | Discharge status: Home / Self Care | Payer: BC Managed Care – PPO | Attending: Emergency Medicine | Admitting: Emergency Medicine

## 2021-08-20 ENCOUNTER — Emergency Department: Payer: BC Managed Care – PPO

## 2021-08-20 ENCOUNTER — Encounter: Payer: Self-pay | Admitting: Emergency Medicine

## 2021-08-20 DIAGNOSIS — Y92410 Unspecified street and highway as the place of occurrence of the external cause: Secondary | ICD-10-CM | POA: Insufficient documentation

## 2021-08-20 DIAGNOSIS — E119 Type 2 diabetes mellitus without complications: Secondary | ICD-10-CM | POA: Diagnosis not present

## 2021-08-20 DIAGNOSIS — S0993XA Unspecified injury of face, initial encounter: Secondary | ICD-10-CM

## 2021-08-20 DIAGNOSIS — S025XXA Fracture of tooth (traumatic), initial encounter for closed fracture: Secondary | ICD-10-CM | POA: Diagnosis not present

## 2021-08-20 MED ORDER — IBUPROFEN 800 MG PO TABS
800.0000 mg | ORAL_TABLET | Freq: Three times a day (TID) | ORAL | 0 refills | Status: DC | PRN
Start: 2021-08-20 — End: 2023-04-02

## 2021-08-20 MED ORDER — AMOXICILLIN 875 MG PO TABS: 875.0000 mg | ORAL_TABLET | Freq: Two times a day (BID) | ORAL | 0 refills | Status: DC

## 2021-08-20 NOTE — Discharge Instructions (Signed)
Follow-up with your regular dentist for care of the fractured tooth.  Take the antibiotic to ensure it does not get infected.  Ibuprofen is for pain as needed.  Return emergency department worsening.  If you do not have a regular dentist he could see one of the following dental clinics. ? ?OPTIONS FOR DENTAL FOLLOW UP CARE ? ? Department of Health and Human Services - Local Safety Net Dental Clinics ?[email or web address] ?  ?[facility] [phone]) ? ?[employer] [phone]) ? ?[place] [phone]) ? ?[facility] [phone]) ? ?[facility] [phone]) ?This clinic caters to the indigent population and is on a lottery system. ?Location: ?[employer] [facility], [employer], [address], [place] ?Clinic Hours: ?Wednesdays from 6pm - 9pm, patients seen by a lottery system. ?For dates, call or go to [email or web address] ?Services: ?Cleanings, fillings and simple extractions. ?Payment Options: ?DENTAL WORK IS FREE OF CHARGE. Bring proof of income or support. ?Best way to get seen: ?Arrive at 5:15 pm - this is a lottery, NOT first come/first serve, so arriving earlier will not increase your chances of being seen. ?  ?  ?[facility] ?[phone] ?Select option 1 for emergencies ?  ?Location: ?[facility] [facility] [facility], [employer], [address] [place] ?Clinic Hours: ?No walk-ins accepted - call the day before to schedule an appointment. ?Check in times are 9:30 am and 1:30 pm. ?Services: ?Simple extractions, temporary fillings, pulpectomy/pulp debridement, uncomplicated abscess drainage. ?Payment Options: ?PAYMENT IS DUE AT THE TIME OF SERVICE.  Fee is usually $100-200, additional surgical procedures (e.g. abscess drainage) may be extra. ?Cash, checks, Visa/MasterCard accepted.  Can file Medicaid if patient is covered for  dental - patient should call case worker to check. ?No discount for [facility] patients. ?Best way to get seen: ?MUST call the day before and get onto the schedule. Can usually be seen the next 1-2 days. No walk-ins accepted. ?  ?  ?[facility] ?[phone] ?  ?Location: ?[facility], [address] [place] ?Clinic Hours: ?M, W, Th, F 8am or 1:30pm, Tues 9a or 1:30 - first come/first served. ?Services: ?Simple extractions, temporary fillings, uncomplicated abscess drainage.  You do not need to be an [facility] resident. ?Payment Options: ?PAYMENT IS DUE AT THE TIME OF SERVICE. ?Dental insurance, otherwise sliding scale - bring proof of income or support. ?Depending on income and treatment needed, cost is usually $50-200. ?Best way to get seen: ?Arrive early as it is first come/first served. ?  ?  ?[facility] ?[phone] ?  ?Location: ?[address] ?Clinic Hours: ?Mon-Thu 8a-5p ?Services: ?Most basic dental services including extractions and fillings. ?Payment Options: ?PAYMENT IS DUE AT THE TIME OF SERVICE. ?Sliding scale, up to 50% off - bring proof if income or support. ?Medicaid with dental option accepted. ?Best way to get seen: ?Call to schedule an appointment, can usually be seen within 2 weeks OR they will try to see walk-ins - show up at 8a or 2p (you may have to wait). ?  ?  ?[facility] ?[phone] ?[place] RESIDENTS ONLY ?  ?Location: ?[facility] [employer], [address] [place], [place] [address] ?Clinic Hours: By appointment only. ?Monday - Thursday 8am-5pm, Friday 8am-12pm ?Services: Cleanings, fillings, extractions. ?Payment Options: ?PAYMENT IS DUE AT THE TIME OF SERVICE. ?Cash, Visa or MasterCard. Sliding scale - $30 minimum per service. ?Best way to get  seen: ?Come in to office, complete packet and make an appointment - need proof of income ?or support monies for each  household member and proof of [facility] residence. ?Usually takes about a month to get in. ?  ?  ?[facility] ?[phone] ?  ?Location: ?[address] [facility] ?Clinic Hours: Walk-in Urgent Care Dental Services are offered Monday-Friday mornings only. ?The numbers of emergencies accepted daily is limited to the number of ?providers available. ?Maximum 15 - Mondays, Wednesdays & Thursdays ?Maximum 10 - Tuesdays & Fridays ?Services: ?You do not need to be a [facility] resident to be seen for a dental emergency. ?Emergencies are defined as pain, swelling, abnormal bleeding, or dental trauma. Walkins will receive x-rays if needed. ?NOTE: Dental cleaning is not an emergency. ?Payment Options: ?PAYMENT IS DUE AT THE TIME OF SERVICE. ?Minimum co-pay is $40.00 for uninsured patients. ?Minimum co-pay is $3.00 for Medicaid with dental coverage. ?Dental Insurance is accepted and must be presented at time of visit. ?Medicare does not cover dental. ?Forms of payment: Cash, credit card, checks. ?Best way to get seen: ?If not previously registered with the clinic, walk-in dental registration begins at 7:15 am and is on a first come/first serve basis. ?If previously registered with the clinic, call to make an appointment. ?  ?  ?[facility] ?[phone] ?[place] RESIDENTS ONLY ?  ?Location: ?[address], [place], [place] ?Clinic Hours: ?Mon-Thu 10a-2p ?Services: Extractions only! ?Payment Options: ?FREE (donations accepted) - bring proof of income or support ?Best way to get seen: ?Call and schedule an appointment OR come at 8am on the 1st Monday of every month (except for holidays) when it is first come/first served. ?  ?  ?[facility] ?[phone] ?  ?Location: ?[address], [place] ?Clinic Hours: ?Friday mornings ?Services, Payment Options, Best way to get seen: ?Call for info ? ?
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
## 2021-08-30 ENCOUNTER — Encounter: Payer: Self-pay | Admitting: Internal Medicine

## 2022-11-14 DIAGNOSIS — J039 Acute tonsillitis, unspecified: Secondary | ICD-10-CM | POA: Diagnosis not present

## 2022-11-14 DIAGNOSIS — R070 Pain in throat: Secondary | ICD-10-CM | POA: Diagnosis not present

## 2023-03-05 ENCOUNTER — Ambulatory Visit: Payer: BC Managed Care – PPO | Admitting: Internal Medicine

## 2023-03-05 NOTE — Progress Notes (Deleted)
Subjective:    Patient ID: Sandra Hays, female    DOB: 26-Jul-1990, 32 y.o.   MRN: [id number]  HPI  Patient presents to clinic today for follow-up of chronic conditions.  DM2: Her last A1c was 11.1, 06/2021.  She is not taking glipizide as prescribed.  She does not check her sugars.  She does not check her feet routinely.  Her last eye exam was.  Flu never.  Pneumovax never.  COVID never.  Iron deficiency anemia: Her last H/H was 10.1/34.6, 06/2021.  She is not taking any oral iron at this time.  She does not follow with hematology.  Varicose veins: She denies any itching or pain at this time.  She does not follow with vascular.  Review of Systems     No past medical history on file.  Current Outpatient Medications  Medication Sig Dispense Refill   amoxicillin (AMOXIL) 875 MG tablet Take 1 tablet (875 mg total) by mouth 2 (two) times daily. 20 tablet 0   glipiZIDE (GLUCOTROL) 10 MG tablet Take 1 tablet (10 mg total) by mouth 2 (two) times daily before a meal. 60 tablet 2   ibuprofen (ADVIL) 800 MG tablet Take 1 tablet (800 mg total) by mouth every 8 (eight) hours as needed. 30 tablet 0   methocarbamol (ROBAXIN) 500 MG tablet Take 1 tablet (500 mg total) by mouth every 8 (eight) hours as needed for muscle spasms. 20 tablet 0   No current facility-administered medications for this visit.    No Known Allergies  Family History  Problem Relation Age of Onset   Diabetes Mother    Parkinson's disease Mother    Irregular heart beat Mother    Healthy Father    Healthy Sister    Diabetes Paternal Grandmother    Breast cancer Other    Colon cancer Neg Hx     Social History   Socioeconomic History   Marital status: Married    Spouse name: Not on file   Number of children: Not on file   Years of education: Not on file   Highest education level: Not on file  Occupational History   Not on file  Tobacco Use   Smoking status: Every Day   Smokeless tobacco: Never  Vaping Use    Vaping status: Never Used  Substance and Sexual Activity   Alcohol use: Not Currently   Drug use: Never   Sexual activity: Not on file  Other Topics Concern   Not on file  Social History Narrative   Not on file   Social Determinants of Health   Financial Resource Strain: Not on file  Food Insecurity: Not on file  Transportation Needs: Not on file  Physical Activity: Not on file  Stress: Not on file  Social Connections: Not on file  Intimate Partner Violence: Not on file     Constitutional: Denies fever, malaise, fatigue, headache or abrupt weight changes.  HEENT: Denies eye pain, eye redness, ear pain, ringing in the ears, wax buildup, runny nose, nasal congestion, bloody nose, or sore throat. Respiratory: Denies difficulty breathing, shortness of breath, cough or sputum production.   Cardiovascular: Denies chest pain, chest tightness, palpitations or swelling in the hands or feet.  Gastrointestinal: Denies abdominal pain, bloating, constipation, diarrhea or blood in the stool.  GU: Denies urgency, frequency, pain with urination, burning sensation, blood in urine, odor or discharge. Musculoskeletal: Denies decrease in range of motion, difficulty with gait, muscle pain or joint pain and swelling.  Skin: Denies redness, rashes, lesions or ulcercations.  Neurological: Denies dizziness, difficulty with memory, difficulty with speech or problems with balance and coordination.  Psych: Denies anxiety, depression, SI/HI.  No other specific complaints in a complete review of systems (except as listed in HPI above).  Objective:   Physical Exam  There were no vitals taken for this visit. Wt Readings from Last 3 Encounters:  08/20/21 (!) 365 lb 1.3 oz (165.6 kg)  07/01/21 (!) 365 lb (165.6 kg)  06/30/21 (!) 363 lb 12.1 oz (165 kg)    General: Appears their stated age, well developed, well nourished in NAD. Skin: Warm, dry and intact. No rashes, lesions or ulcerations  noted. HEENT: Head: normal shape and size; Eyes: sclera white, no icterus, conjunctiva pink, PERRLA and EOMs intact; Ears: Tm's gray and intact, normal light reflex; Nose: mucosa pink and moist, septum midline; Throat/Mouth: Teeth present, mucosa pink and moist, no exudate, lesions or ulcerations noted.  Neck:  Neck supple, trachea midline. No masses, lumps or thyromegaly present.  Cardiovascular: Normal rate and rhythm. S1,S2 noted.  No murmur, rubs or gallops noted. No JVD or BLE edema. No carotid bruits noted. Pulmonary/Chest: Normal effort and positive vesicular breath sounds. No respiratory distress. No wheezes, rales or ronchi noted.  Abdomen: Soft and nontender. Normal bowel sounds. No distention or masses noted. Liver, spleen and kidneys non palpable. Musculoskeletal: Normal range of motion. No signs of joint swelling. No difficulty with gait.  Neurological: Alert and oriented. Cranial nerves II-XII grossly intact. Coordination normal.  Psychiatric: Mood and affect normal. Behavior is normal. Judgment and thought content normal.     BMET    Component Value Date/Time   NA 137 06/23/2021 1027   NA 140 05/05/2014 1100   K 4.3 06/23/2021 1027   K 4.0 05/05/2014 1100   CL 101 06/23/2021 1027   CL 106 05/05/2014 1100   CO2 31 06/23/2021 1027   CO2 25 05/05/2014 1100   GLUCOSE 264 (H) 06/23/2021 1027   GLUCOSE 117 (H) 05/05/2014 1100   BUN 7 06/23/2021 1027   BUN 8 05/05/2014 1100   CREATININE 0.60 06/23/2021 1027   CALCIUM 9.2 06/23/2021 1027   CALCIUM 8.4 (L) 05/05/2014 1100   GFRNONAA >60 02/29/2020 2154   GFRNONAA >60 05/05/2014 1100   GFRNONAA >60 11/09/2013 1854   GFRAA >60 05/05/2014 1100   GFRAA >60 11/09/2013 1854    Lipid Panel     Component Value Date/Time   CHOL 157 06/23/2021 1027   TRIG 115 06/23/2021 1027   HDL 42 (L) 06/23/2021 1027   CHOLHDL 3.7 06/23/2021 1027   LDLCALC 94 06/23/2021 1027    CBC    Component Value Date/Time   WBC 6.9 06/23/2021  1027   RBC 4.82 06/23/2021 1027   HGB 10.1 (L) 06/23/2021 1027   HGB 9.8 (L) 05/05/2014 1100   HCT 34.6 (L) 06/23/2021 1027   HCT 31.2 (L) 05/05/2014 1100   PLT 498 (H) 06/23/2021 1027   PLT 455 (H) 05/05/2014 1100   MCV 71.8 (L) 06/23/2021 1027   MCV 69 (L) 05/05/2014 1100   MCH 21.0 (L) 06/23/2021 1027   MCHC 29.2 (L) 06/23/2021 1027   RDW 17.4 (H) 06/23/2021 1027   RDW 20.4 (H) 05/05/2014 1100   LYMPHSABS 1.9 11/09/2013 1854   MONOABS 0.5 11/09/2013 1854   EOSABS 0.6 11/09/2013 1854   BASOSABS 0.1 11/09/2013 1854    Hgb A1C Lab Results  Component Value Date   HGBA1C 11.1 (H)  06/23/2021            Assessment & Plan:     RTC in 3 months for annual exam Nicki Reaper, NP

## 2023-03-12 ENCOUNTER — Encounter: Payer: Self-pay | Admitting: Internal Medicine

## 2023-03-12 ENCOUNTER — Ambulatory Visit (INDEPENDENT_AMBULATORY_CARE_PROVIDER_SITE_OTHER): Payer: BLUE CROSS/BLUE SHIELD | Admitting: Internal Medicine

## 2023-03-12 VITALS — BP 125/76 | HR 89 | Ht 67.0 in | Wt 369.0 lb

## 2023-03-12 DIAGNOSIS — G4700 Insomnia, unspecified: Secondary | ICD-10-CM | POA: Insufficient documentation

## 2023-03-12 DIAGNOSIS — R0683 Snoring: Secondary | ICD-10-CM | POA: Diagnosis not present

## 2023-03-12 DIAGNOSIS — D500 Iron deficiency anemia secondary to blood loss (chronic): Secondary | ICD-10-CM | POA: Diagnosis not present

## 2023-03-12 DIAGNOSIS — E1165 Type 2 diabetes mellitus with hyperglycemia: Secondary | ICD-10-CM

## 2023-03-12 DIAGNOSIS — F5101 Primary insomnia: Secondary | ICD-10-CM | POA: Diagnosis not present

## 2023-03-12 MED ORDER — GLIPIZIDE 10 MG PO TABS: 10.0000 mg | ORAL_TABLET | Freq: Two times a day (BID) | ORAL | 1 refills | Status: DC

## 2023-03-12 NOTE — Patient Instructions (Signed)

## 2023-03-12 NOTE — Assessment & Plan Note (Signed)
She recently had blood work done by [employer] MD, will provide me with a copy through MyChart She is requesting referral to hematology for iron infusions, will defer until blood work reviewed

## 2023-03-12 NOTE — Assessment & Plan Note (Signed)
Concern for sleep apnea Will refer for sleep study

## 2023-03-12 NOTE — Assessment & Plan Note (Signed)
Waiting on prior authorization for [facility] Encourage high-protein, low-carb diet and exercise for weight loss

## 2023-03-12 NOTE — Assessment & Plan Note (Signed)
She reports she recently had labs done at [facility] MD, will send me a copy of these through MyChart Waiting for prior Auth on Mounjaro from [facility] MD Will refill glipizide 10 mg twice daily Reinforced low-carb diet and exercise for weight loss Referral to ophthalmology for eye exam Encouraged routine foot exam She declines immunizations

## 2023-03-12 NOTE — Progress Notes (Signed)
Subjective:    Patient ID: Sandra Hays, female    DOB: 01-13-1991, 32 y.o.   MRN: [id number]  HPI  Patient presents to clinic today for follow-up of chronic conditions.  DM2: Her last A1c was 9.5, 02/2023. She is waiting on pre authorization for mounjaro. She is not taking glipizide as prescribed. She has been on metformin in the past.  She does not check her sugars.  She does not check her feet routinely.  Her last eye exam was > 1 year.  Flu never.  Pneumovax never.  COVID never.  Iron deficiency anemia: Her last H/H was 10.1/34.6, 06/2021.  She has recently had blood work and reports her levels were low and the MD requested she have an iron infusion. She is not taking any oral iron at this time.  She does not follow with hematology.  Insomnia: She has difficulty staying and falling asleep. She does snore. She is not currently taking any medication for this. There is no sleep study on file.   Review of Systems     No past medical history on file.  Current Outpatient Medications  Medication Sig Dispense Refill  . amoxicillin (AMOXIL) 875 MG tablet Take 1 tablet (875 mg total) by mouth 2 (two) times daily. 20 tablet 0  . glipiZIDE (GLUCOTROL) 10 MG tablet Take 1 tablet (10 mg total) by mouth 2 (two) times daily before a meal. 60 tablet 2  . ibuprofen (ADVIL) 800 MG tablet Take 1 tablet (800 mg total) by mouth every 8 (eight) hours as needed. 30 tablet 0  . methocarbamol (ROBAXIN) 500 MG tablet Take 1 tablet (500 mg total) by mouth every 8 (eight) hours as needed for muscle spasms. 20 tablet 0   No current facility-administered medications for this visit.    No Known Allergies  Family History  Problem Relation Age of Onset  . Diabetes Mother   . Parkinson's disease Mother   . Irregular heart beat Mother   . Healthy Father   . Healthy Sister   . Diabetes Paternal Grandmother   . Breast cancer Other   . Colon cancer Neg Hx     Social History   Socioeconomic History  .  Marital status: Married    Spouse name: Not on file  . Number of children: Not on file  . Years of education: Not on file  . Highest education level: Not on file  Occupational History  . Not on file  Tobacco Use  . Smoking status: Every Day  . Smokeless tobacco: Never  Vaping Use  . Vaping status: Never Used  Substance and Sexual Activity  . Alcohol use: Not Currently  . Drug use: Never  . Sexual activity: Not on file  Other Topics Concern  . Not on file  Social History Narrative  . Not on file   Social Determinants of Health   Financial Resource Strain: Not on file  Food Insecurity: Not on file  Transportation Needs: Not on file  Physical Activity: Not on file  Stress: Not on file  Social Connections: Not on file  Intimate Partner Violence: Not on file     Constitutional: Denies fever, malaise, fatigue, headache or abrupt weight changes.  HEENT: Denies eye pain, eye redness, ear pain, ringing in the ears, wax buildup, runny nose, nasal congestion, bloody nose, or sore throat. Respiratory: Denies difficulty breathing, shortness of breath, cough or sputum production.   Cardiovascular: Pt reports intermittent swelling in legs. Denies chest pain, chest  tightness, palpitations or swelling in the hands or feet.  Gastrointestinal: Denies abdominal pain, bloating, constipation, diarrhea or blood in the stool.  GU: Denies urgency, frequency, pain with urination, burning sensation, blood in urine, odor or discharge. Musculoskeletal: Denies decrease in range of motion, difficulty with gait, muscle pain or joint pain and swelling.  Skin: Denies redness, rashes, lesions or ulcercations.  Neurological: Pt reports paresthesia of feet, insomnia. Denies dizziness, difficulty with memory, difficulty with speech or problems with balance and coordination.  Psych: Denies anxiety, depression, SI/HI.  No other specific complaints in a complete review of systems (except as listed in HPI  above).  Objective:   Physical Exam   BP 125/76   Pulse 89   Ht 5\' 7"  (1.702 m)   Wt (!) 369 lb (167.4 kg)   BMI 57.79 kg/m   Wt Readings from Last 3 Encounters:  08/20/21 (!) 365 lb 1.3 oz (165.6 kg)  07/01/21 (!) 365 lb (165.6 kg)  06/30/21 (!) 363 lb 12.1 oz (165 kg)    General: Appears her stated age, obese, in NAD. Skin: Warm, dry and intact. No rulcerations noted. HEENT: Head: normal shape and size; Eyes: sclera white, no icterus, conjunctiva pink, PERRLA and EOMs intact;  Cardiovascular: Normal rate and rhythm. S1,S2 noted.  No murmur, rubs or gallops noted. No JVD.  Trace pitting BLE edema.  Pulmonary/Chest: Normal effort and positive vesicular breath sounds. No respiratory distress. No wheezes, rales or ronchi noted.  Musculoskeletal: No difficulty with gait.  Neurological: Alert and oriented. Coordination normal.  Psychiatric: Mood and affect normal. Behavior is normal. Judgment and thought content normal.   BMET    Component Value Date/Time   NA 137 06/23/2021 1027   NA 140 05/05/2014 1100   K 4.3 06/23/2021 1027   K 4.0 05/05/2014 1100   CL 101 06/23/2021 1027   CL 106 05/05/2014 1100   CO2 31 06/23/2021 1027   CO2 25 05/05/2014 1100   GLUCOSE 264 (H) 06/23/2021 1027   GLUCOSE 117 (H) 05/05/2014 1100   BUN 7 06/23/2021 1027   BUN 8 05/05/2014 1100   CREATININE 0.60 06/23/2021 1027   CALCIUM 9.2 06/23/2021 1027   CALCIUM 8.4 (L) 05/05/2014 1100   GFRNONAA >60 02/29/2020 2154   GFRNONAA >60 05/05/2014 1100   GFRNONAA >60 11/09/2013 1854   GFRAA >60 05/05/2014 1100   GFRAA >60 11/09/2013 1854    Lipid Panel     Component Value Date/Time   CHOL 157 06/23/2021 1027   TRIG 115 06/23/2021 1027   HDL 42 (L) 06/23/2021 1027   CHOLHDL 3.7 06/23/2021 1027   LDLCALC 94 06/23/2021 1027    CBC    Component Value Date/Time   WBC 6.9 06/23/2021 1027   RBC 4.82 06/23/2021 1027   HGB 10.1 (L) 06/23/2021 1027   HGB 9.8 (L) 05/05/2014 1100   HCT 34.6 (L)  06/23/2021 1027   HCT 31.2 (L) 05/05/2014 1100   PLT 498 (H) 06/23/2021 1027   PLT 455 (H) 05/05/2014 1100   MCV 71.8 (L) 06/23/2021 1027   MCV 69 (L) 05/05/2014 1100   MCH 21.0 (L) 06/23/2021 1027   MCHC 29.2 (L) 06/23/2021 1027   RDW 17.4 (H) 06/23/2021 1027   RDW 20.4 (H) 05/05/2014 1100   LYMPHSABS 1.9 11/09/2013 1854   MONOABS 0.5 11/09/2013 1854   EOSABS 0.6 11/09/2013 1854   BASOSABS 0.1 11/09/2013 1854    Hgb A1C Lab Results  Component Value Date   HGBA1C 11.1 (  H) 06/23/2021           Assessment & Plan:     RTC in 3 months for your annual exam Nicki Reaper, NP

## 2023-03-19 ENCOUNTER — Inpatient Hospital Stay: Payer: Self-pay | Admitting: Internal Medicine

## 2023-03-19 ENCOUNTER — Other Ambulatory Visit: Payer: Self-pay

## 2023-03-19 ENCOUNTER — Inpatient Hospital Stay: Payer: BLUE CROSS/BLUE SHIELD | Admitting: Internal Medicine

## 2023-03-19 ENCOUNTER — Inpatient Hospital Stay: Payer: Self-pay

## 2023-03-26 ENCOUNTER — Inpatient Hospital Stay: Payer: BLUE CROSS/BLUE SHIELD | Admitting: Internal Medicine

## 2023-03-26 ENCOUNTER — Inpatient Hospital Stay: Payer: BLUE CROSS/BLUE SHIELD

## 2023-04-02 ENCOUNTER — Inpatient Hospital Stay: Payer: BLUE CROSS/BLUE SHIELD

## 2023-04-02 ENCOUNTER — Inpatient Hospital Stay: Payer: BLUE CROSS/BLUE SHIELD | Attending: Internal Medicine | Admitting: Internal Medicine

## 2023-04-02 ENCOUNTER — Encounter: Payer: Self-pay | Admitting: Internal Medicine

## 2023-04-02 VITALS — BP 117/85 | HR 87 | Temp 97.6°F | Wt 361.0 lb

## 2023-04-02 DIAGNOSIS — D649 Anemia, unspecified: Secondary | ICD-10-CM | POA: Insufficient documentation

## 2023-04-02 DIAGNOSIS — Z803 Family history of malignant neoplasm of breast: Secondary | ICD-10-CM | POA: Diagnosis not present

## 2023-04-02 DIAGNOSIS — N920 Excessive and frequent menstruation with regular cycle: Secondary | ICD-10-CM | POA: Insufficient documentation

## 2023-04-02 DIAGNOSIS — F17210 Nicotine dependence, cigarettes, uncomplicated: Secondary | ICD-10-CM | POA: Diagnosis not present

## 2023-04-02 LAB — CBC WITH DIFFERENTIAL/PLATELET
Abs Immature Granulocytes: 0.02 10*3/uL (ref 0.00–0.07)
Basophils Absolute: 0.1 10*3/uL (ref 0.0–0.1)
Basophils Relative: 1 %
Eosinophils Absolute: 0.8 10*3/uL — ABNORMAL HIGH (ref 0.0–0.5)
Eosinophils Relative: 11 %
HCT: 32.6 % — ABNORMAL LOW (ref 36.0–46.0)
Hemoglobin: 9.6 g/dL — ABNORMAL LOW (ref 12.0–15.0)
Immature Granulocytes: 0 %
Lymphocytes Relative: 30 %
Lymphs Abs: 2.2 10*3/uL (ref 0.7–4.0)
MCH: 21 pg — ABNORMAL LOW (ref 26.0–34.0)
MCHC: 29.4 g/dL — ABNORMAL LOW (ref 30.0–36.0)
MCV: 71.3 fL — ABNORMAL LOW (ref 80.0–100.0)
Monocytes Absolute: 0.6 10*3/uL (ref 0.1–1.0)
Monocytes Relative: 8 %
Neutro Abs: 3.8 10*3/uL (ref 1.7–7.7)
Neutrophils Relative %: 50 %
Platelets: 481 10*3/uL — ABNORMAL HIGH (ref 150–400)
RBC: 4.57 MIL/uL (ref 3.87–5.11)
RDW: 20.1 % — ABNORMAL HIGH (ref 11.5–15.5)
WBC: 7.4 10*3/uL (ref 4.0–10.5)
nRBC: 0 % (ref 0.0–0.2)

## 2023-04-02 LAB — COMPREHENSIVE METABOLIC PANEL
ALT: 20 U/L (ref 0–44)
AST: 21 U/L (ref 15–41)
Albumin: 3.5 g/dL (ref 3.5–5.0)
Alkaline Phosphatase: 71 U/L (ref 38–126)
Anion gap: 7 (ref 5–15)
BUN: 9 mg/dL (ref 6–20)
CO2: 26 mmol/L (ref 22–32)
Calcium: 8.8 mg/dL — ABNORMAL LOW (ref 8.9–10.3)
Chloride: 105 mmol/L (ref 98–111)
Creatinine, Ser: 0.55 mg/dL (ref 0.44–1.00)
GFR, Estimated: >60 mL/min (ref >60)
Glucose, Bld: 166 mg/dL — ABNORMAL HIGH (ref 70–99)
Potassium: 4 mmol/L (ref 3.5–5.1)
Sodium: 138 mmol/L (ref 135–145)
Total Bilirubin: <0.2 mg/dL (ref <1.2)
Total Protein: 7.4 g/dL (ref 6.5–8.1)

## 2023-04-02 LAB — IRON AND TIBC
Iron: 34 ug/dL (ref 28–170)
Saturation Ratios: 7 % — ABNORMAL LOW (ref 10.4–31.8)
TIBC: 508 ug/dL — ABNORMAL HIGH (ref 250–450)
UIBC: 474 ug/dL

## 2023-04-02 LAB — FERRITIN: Ferritin: 6 ng/mL — ABNORMAL LOW (ref 11–307)

## 2023-04-02 LAB — FOLATE: Folate: 21.7 ng/mL (ref >5.9)

## 2023-04-02 LAB — VITAMIN B12: Vitamin B-12: 554 pg/mL (ref 180–914)

## 2023-04-02 LAB — LACTATE DEHYDROGENASE: LDH: 181 U/L (ref 98–192)

## 2023-04-02 NOTE — Progress Notes (Signed)
[facility]  [phone] [phone]  ID: Sandra Hays OB: [birth year]  MR#: [id number]  AOZ#:[id number]  Patient Care Team: Lorre Munroe, NP as PCP - General (Internal Medicine) Michaelyn Barter, MD as Consulting Physician (Oncology)  REFERRING PROVIDER: Nicki Reaper, NP  REASON FOR REFERRAL: anemia  HPI: Sandra Hays is a 32 y.o. female with past medical history of iron deficiency anemia referred to hematology for management of anemia.  Patient reports longstanding history of iron deficiency.  Reports intolerance to oral iron causing constipation and upset stomach. Reports irregular menstrual cycles, heavy bleeding 2 weeks at a time.  Has to change pads every hour.  Has previously seen OB/GYN about a year or 2 ago. Denies any prior endoscopy or colonoscopy.  Denies any gastric bypass surgery. Uses ibuprofen occasionally.  REVIEW OF SYSTEMS:   ROS  As per HPI. Otherwise, a complete review of systems is negative.  PAST MEDICAL HISTORY: History reviewed. No pertinent past medical history.  PAST SURGICAL HISTORY: Past Surgical History:  Procedure Laterality Date   NO PAST SURGERIES      FAMILY HISTORY: Family History  Problem Relation Age of Onset   Diabetes Mother    Parkinson's disease Mother    Irregular heart beat Mother    Healthy Father    Healthy Sister    Diabetes Paternal Grandmother    Breast cancer Other    Colon cancer Neg Hx     HEALTH MAINTENANCE: Social History   Tobacco Use   Smoking status: Every Day    Types: Cigarettes   Smokeless tobacco: Never  Vaping Use   Vaping status: Never Used  Substance Use Topics   Alcohol use: Not Currently   Drug use: Never     No Known Allergies  Current Outpatient Medications  Medication Sig Dispense Refill   glipiZIDE (GLUCOTROL) 10 MG tablet Take 1 tablet (10 mg total) by mouth 2 (two) times daily before a meal. 180 tablet 1   No current  facility-administered medications for this visit.    OBJECTIVE: Vitals:   04/02/23 1125  BP: 117/85  Pulse: 87  Temp: 97.6 F (36.4 C)  SpO2: 100%     Body mass index is 56.54 kg/m.      General: Well-developed, well-nourished, no acute distress. Eyes: Pink conjunctiva, anicteric sclera. HEENT: Normocephalic, moist mucous membranes, clear oropharnyx. Lungs: Clear to auscultation bilaterally. Heart: Regular rate and rhythm. No rubs, murmurs, or gallops. Abdomen: Soft, nontender, nondistended. No organomegaly noted, normoactive bowel sounds. Musculoskeletal: No edema, cyanosis, or clubbing. Neuro: Alert, answering all questions appropriately. Cranial nerves grossly intact. Skin: No rashes or petechiae noted. Psych: Normal affect. Lymphatics: No cervical, calvicular, axillary or inguinal LAD.   LAB RESULTS:  Lab Results  Component Value Date   NA 137 06/23/2021   K 4.3 06/23/2021   CL 101 06/23/2021   CO2 31 06/23/2021   GLUCOSE 264 (H) 06/23/2021   BUN 7 06/23/2021   CREATININE 0.60 06/23/2021   CALCIUM 9.2 06/23/2021   PROT 7.3 06/23/2021   ALBUMIN 3.6 02/29/2020   AST 14 06/23/2021   ALT 14 06/23/2021   ALKPHOS 69 02/29/2020   BILITOT 0.3 06/23/2021   GFRNONAA >60 02/29/2020   GFRAA >60 05/05/2014    Lab Results  Component Value Date   WBC 6.9 06/23/2021   NEUTROABS 5.5 11/09/2013   HGB 10.1 (L) 06/23/2021   HCT 34.6 (L) 06/23/2021   MCV 71.8 (L) 06/23/2021   PLT 498 (H)  06/23/2021    No results found for: "TIBC", "FERRITIN", "IRONPCTSAT"   STUDIES: No results found.  ASSESSMENT AND PLAN:   Sandra Hays is a 32 y.o. female with pmh of iron deficiency anemia referred to hematology for management of anemia.  # Anemia -Patient reports longstanding history of iron deficiency anemia.  Reports heavy menstrual cycles.  Has previously seen OB/GYN.  I do not have any recent labs done.  Last from February 2023 with hemoglobin of 10.1, MCV 71. -Will  obtain labs today for the anemia workup. -Patient is intolerant to oral iron causes constipation upset stomach.  If her iron levels come back low, will schedule for IV Venofer weekly x 5 doses.  I have discussed the logistics of it.  Side effects such as allergic reaction, nausea, back pain was discussed.  Patient is agreeable to proceed.  I will follow-up with him in 4 months.  With labs.  Orders Placed This Encounter  Procedures   CBC with Differential/Platelet   Iron and TIBC   Ferritin   Vitamin B12   Folate   Comprehensive metabolic panel   Lactate dehydrogenase   Haptoglobin   RTC in 4 months for MD visit, labs.  Patient expressed understanding and was in agreement with this plan. She also understands that She can call clinic at any time with any questions, concerns, or complaints.   I spent a total of 45 minutes reviewing chart data, face-to-face evaluation with the patient, counseling and coordination of care as detailed above.  Michaelyn Barter, MD   04/02/2023 11:29 AM

## 2023-04-02 NOTE — Progress Notes (Signed)
She has taken the iron pills before but the side effects were to much. 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  Patient doesn't mind [facility] for any kind of testing.

## 2023-04-03 LAB — HAPTOGLOBIN: Haptoglobin: 140 mg/dL (ref 33–278)

## 2023-04-05 ENCOUNTER — Encounter: Payer: Self-pay | Admitting: Internal Medicine

## 2023-04-05 NOTE — Addendum Note (Signed)
Addended byMichaelyn Barter on: 04/05/2023 03:33 PM   Modules accepted: Orders

## 2023-04-19 ENCOUNTER — Inpatient Hospital Stay: Payer: BLUE CROSS/BLUE SHIELD

## 2023-04-21 ENCOUNTER — Inpatient Hospital Stay: Payer: BLUE CROSS/BLUE SHIELD | Attending: Internal Medicine

## 2023-04-21 ENCOUNTER — Inpatient Hospital Stay: Payer: BLUE CROSS/BLUE SHIELD

## 2023-04-21 VITALS — BP 140/90 | HR 75 | Temp 97.6°F

## 2023-04-21 DIAGNOSIS — D649 Anemia, unspecified: Secondary | ICD-10-CM | POA: Insufficient documentation

## 2023-04-21 DIAGNOSIS — D500 Iron deficiency anemia secondary to blood loss (chronic): Secondary | ICD-10-CM

## 2023-04-21 LAB — HM DIABETES EYE EXAM

## 2023-04-21 MED ORDER — IRON SUCROSE 20 MG/ML IV SOLN
200.0000 mg | Freq: Once | INTRAVENOUS | Status: AC
  Administered 2023-04-21: 200 mg via INTRAVENOUS

## 2023-04-21 MED ORDER — SODIUM CHLORIDE 0.9% FLUSH
10.0000 mL | Freq: Once | INTRAVENOUS | Status: AC | PRN
  Administered 2023-04-21: 10 mL
  Filled 2023-04-21: qty 10

## 2023-04-26 ENCOUNTER — Inpatient Hospital Stay: Payer: BLUE CROSS/BLUE SHIELD

## 2023-04-26 VITALS — BP 149/98 | HR 78 | Temp 97.2°F | Resp 16

## 2023-04-26 DIAGNOSIS — D500 Iron deficiency anemia secondary to blood loss (chronic): Secondary | ICD-10-CM

## 2023-04-26 DIAGNOSIS — D649 Anemia, unspecified: Secondary | ICD-10-CM | POA: Diagnosis not present

## 2023-04-26 MED ORDER — IRON SUCROSE 20 MG/ML IV SOLN
200.0000 mg | Freq: Once | INTRAVENOUS | Status: AC
  Administered 2023-04-26: 200 mg via INTRAVENOUS
  Filled 2023-04-26: qty 10

## 2023-04-26 MED ORDER — SODIUM CHLORIDE 0.9% FLUSH
10.0000 mL | Freq: Two times a day (BID) | INTRAVENOUS | Status: DC
  Administered 2023-04-26: 10 mL via INTRAVENOUS
  Filled 2023-04-26: qty 10

## 2023-04-26 NOTE — Patient Instructions (Signed)
Iron Sucrose Injection What is this medication? IRON SUCROSE (EYE ern SOO krose) treats low levels of iron (iron deficiency anemia) in people with kidney disease. Iron is a mineral that plays an important role in making red blood cells, which carry oxygen from your lungs to the rest of your body. This medicine may be used for other purposes; ask your health care provider or pharmacist if you have questions. COMMON BRAND NAME(S): Venofer What should I tell my care team before I take this medication? They need to know if you have any of these conditions: Anemia not caused by low iron levels Heart disease High levels of iron in the blood Kidney disease Liver disease An unusual or allergic reaction to iron, other medications, foods, dyes, or preservatives Pregnant or trying to get pregnant Breastfeeding How should I use this medication? This medication is for infusion into a vein. It is given in a hospital or clinic setting. Talk to your care team about the use of this medication in children. While this medication may be prescribed for children as young as 2 years for selected conditions, precautions do apply. Overdosage: If you think you have taken too much of this medicine contact a poison control center or emergency room at once. NOTE: This medicine is only for you. Do not share this medicine with others. What if I miss a dose? Keep appointments for follow-up doses. It is important not to miss your dose. Call your care team if you are unable to keep an appointment. What may interact with this medication? Do not take this medication with any of the following: Deferoxamine Dimercaprol Other iron products This medication may also interact with the following: Chloramphenicol Deferasirox This list may not describe all possible interactions. Give your health care provider a list of all the medicines, herbs, non-prescription drugs, or dietary supplements you use. Also tell them if you smoke,  drink alcohol, or use illegal drugs. Some items may interact with your medicine. What should I watch for while using this medication? Visit your care team regularly. Tell your care team if your symptoms do not start to get better or if they get worse. You may need blood work done while you are taking this medication. You may need to follow a special diet. Talk to your care team. Foods that contain iron include: whole grains/cereals, dried fruits, beans, or peas, leafy green vegetables, and organ meats (liver, kidney). What side effects may I notice from receiving this medication? Side effects that you should report to your care team as soon as possible: Allergic reactions--skin rash, itching, hives, swelling of the face, lips, tongue, or throat Low blood pressure--dizziness, feeling faint or lightheaded, blurry vision Shortness of breath Side effects that usually do not require medical attention (report to your care team if they continue or are bothersome): Flushing Headache Joint pain Muscle pain Nausea Pain, redness, or irritation at injection site This list may not describe all possible side effects. Call your doctor for medical advice about side effects. You may report side effects to FDA at 1-800-FDA-1088. Where should I keep my medication? This medication is given in a hospital or clinic. It will not be stored at home. NOTE: This sheet is a summary. It may not cover all possible information. If you have questions about this medicine, talk to your doctor, pharmacist, or health care provider.  2024 Elsevier/Gold Standard (2022-10-09 00:00:00)

## 2023-05-03 ENCOUNTER — Inpatient Hospital Stay: Payer: BLUE CROSS/BLUE SHIELD

## 2023-05-03 ENCOUNTER — Encounter: Payer: Self-pay | Admitting: Internal Medicine

## 2023-05-03 VITALS — BP 127/89 | HR 76 | Temp 98.1°F | Resp 18

## 2023-05-03 DIAGNOSIS — D649 Anemia, unspecified: Secondary | ICD-10-CM | POA: Diagnosis not present

## 2023-05-03 DIAGNOSIS — D500 Iron deficiency anemia secondary to blood loss (chronic): Secondary | ICD-10-CM

## 2023-05-03 MED ORDER — SODIUM CHLORIDE 0.9% FLUSH
10.0000 mL | Freq: Once | INTRAVENOUS | Status: AC | PRN
  Administered 2023-05-03: 10 mL
  Filled 2023-05-03: qty 10

## 2023-05-03 MED ORDER — IRON SUCROSE 20 MG/ML IV SOLN
200.0000 mg | Freq: Once | INTRAVENOUS | Status: AC
  Administered 2023-05-03: 200 mg via INTRAVENOUS
  Filled 2023-05-03: qty 10

## 2023-05-04 ENCOUNTER — Telehealth: Payer: Self-pay | Admitting: Internal Medicine

## 2023-05-04 DIAGNOSIS — G4733 Obstructive sleep apnea (adult) (pediatric): Secondary | ICD-10-CM

## 2023-05-04 NOTE — Telephone Encounter (Signed)
Received sleep study results from [facility].  Evidence of severe sleep apnea.

## 2023-05-04 NOTE — Telephone Encounter (Signed)
DME order placed to be faxed to [facility]

## 2023-05-04 NOTE — Addendum Note (Signed)
Addended by: Lorre Munroe on: 05/04/2023 02:43 PM   Modules accepted: Orders

## 2023-05-06 ENCOUNTER — Inpatient Hospital Stay: Payer: BLUE CROSS/BLUE SHIELD

## 2023-05-06 VITALS — BP 138/99 | HR 80 | Temp 98.0°F | Resp 18

## 2023-05-06 DIAGNOSIS — D649 Anemia, unspecified: Secondary | ICD-10-CM | POA: Diagnosis not present

## 2023-05-06 DIAGNOSIS — D500 Iron deficiency anemia secondary to blood loss (chronic): Secondary | ICD-10-CM

## 2023-05-06 DIAGNOSIS — H5213 Myopia, bilateral: Secondary | ICD-10-CM | POA: Diagnosis not present

## 2023-05-06 MED ORDER — SODIUM CHLORIDE 0.9% FLUSH
10.0000 mL | Freq: Two times a day (BID) | INTRAVENOUS | Status: DC
Start: 2023-05-06 — End: 2023-05-06
  Administered 2023-05-06: 10 mL via INTRAVENOUS
  Filled 2023-05-06: qty 10

## 2023-05-06 MED ORDER — IRON SUCROSE 20 MG/ML IV SOLN
200.0000 mg | Freq: Once | INTRAVENOUS | Status: AC
  Administered 2023-05-06: 200 mg via INTRAVENOUS

## 2023-05-06 NOTE — Progress Notes (Signed)
Patient declined to wait the 30 minutes for post iron infusion observation today. Tolerated infusion well. VSS. 

## 2023-05-10 ENCOUNTER — Inpatient Hospital Stay: Payer: BLUE CROSS/BLUE SHIELD

## 2023-05-17 ENCOUNTER — Inpatient Hospital Stay: Payer: BLUE CROSS/BLUE SHIELD

## 2023-05-17 VITALS — BP 128/101 | HR 80 | Temp 98.7°F | Resp 18

## 2023-05-17 DIAGNOSIS — D500 Iron deficiency anemia secondary to blood loss (chronic): Secondary | ICD-10-CM

## 2023-05-17 DIAGNOSIS — D649 Anemia, unspecified: Secondary | ICD-10-CM | POA: Diagnosis not present

## 2023-05-17 MED ORDER — SODIUM CHLORIDE 0.9% FLUSH
10.0000 mL | Freq: Two times a day (BID) | INTRAVENOUS | Status: DC
Start: 2023-05-17 — End: 2023-05-17
  Administered 2023-05-17: 10 mL via INTRAVENOUS
  Filled 2023-05-17: qty 10

## 2023-05-17 MED ORDER — IRON SUCROSE 20 MG/ML IV SOLN
200.0000 mg | Freq: Once | INTRAVENOUS | Status: AC
  Administered 2023-05-17: 200 mg via INTRAVENOUS

## 2023-06-15 ENCOUNTER — Ambulatory Visit: Payer: BC Managed Care – PPO | Admitting: Internal Medicine

## 2023-06-17 ENCOUNTER — Encounter: Payer: BC Managed Care – PPO | Admitting: Internal Medicine

## 2023-06-17 NOTE — Progress Notes (Deleted)
 Subjective:    Patient ID: Sandra Hays, female    DOB: [birth year], 33 y.o.   MRN: [id number]  HPI  Patient presents to clinic today for her annual exam.  Flu: Never Tetanus: 09/2020 COVID: Never Pneumovax: Never Pap smear: Vision screening: Dentist:  Diet: Exercise:   Review of Systems   No past medical history on file.  Current Outpatient Medications  Medication Sig Dispense Refill   glipiZIDE (GLUCOTROL) 10 MG tablet Take 1 tablet (10 mg total) by mouth 2 (two) times daily before a meal. 180 tablet 1   No current facility-administered medications for this visit.    No Known Allergies  Family History  Problem Relation Age of Onset   Diabetes Mother    Parkinson's disease Mother    Irregular heart beat Mother    Healthy Father    Healthy Sister    Diabetes Paternal Grandmother    Breast cancer Other    Colon cancer Neg Hx     Social History   Socioeconomic History   Marital status: Married    Spouse name: Not on file   Number of children: Not on file   Years of education: Not on file   Highest education level: Not on file  Occupational History   Not on file  Tobacco Use   Smoking status: Every Day    Types: Cigarettes   Smokeless tobacco: Never  Vaping Use   Vaping status: Never Used  Substance and Sexual Activity   Alcohol use: Not Currently   Drug use: Never   Sexual activity: Not on file  Other Topics Concern   Not on file  Social History Narrative   Not on file   Social Drivers of Health   Financial Resource Strain: Not on file  Food Insecurity: No Food Insecurity (04/02/2023)   Hunger Vital Sign    Worried About Running Out of Food in the Last Year: Never true    Ran Out of Food in the Last Year: Never true  Transportation Needs: Not on file  Physical Activity: Not on file  Stress: Not on file  Social Connections: Not on file  Intimate Partner Violence: Not At Risk (04/02/2023)   Humiliation, Afraid, Rape, and Kick  questionnaire    Fear of Current or Ex-Partner: No    Emotionally Abused: No    Physically Abused: No    Sexually Abused: No     Constitutional: Denies fever, malaise, fatigue, headache or abrupt weight changes.  HEENT: Denies eye pain, eye redness, ear pain, ringing in the ears, wax buildup, runny nose, nasal congestion, bloody nose, or sore throat. Respiratory: Denies difficulty breathing, shortness of breath, cough or sputum production.   Cardiovascular: Denies chest pain, chest tightness, palpitations or swelling in the hands or feet.  Gastrointestinal: Denies abdominal pain, bloating, constipation, diarrhea or blood in the stool.  GU: Denies urgency, frequency, pain with urination, burning sensation, blood in urine, odor or discharge. Musculoskeletal: Denies decrease in range of motion, difficulty with gait, muscle pain or joint pain and swelling.  Skin: Denies redness, rashes, lesions or ulcercations.  Neurological: Patient reports insomnia.  Denies dizziness, difficulty with memory, difficulty with speech or problems with balance and coordination.  Psych: Denies anxiety, depression, SI/HI.  No other specific complaints in a complete review of systems (except as listed in HPI above).      Objective:   Physical Exam  There were no vitals taken for this visit. Wt Readings from Last 3 Encounters:  04/02/23 (!) 361 lb (163.7 kg)  03/12/23 (!) 369 lb (167.4 kg)  08/20/21 (!) 365 lb 1.3 oz (165.6 kg)    General: Appears their stated age, well developed, well nourished in NAD. Skin: Warm, dry and intact. No rashes, lesions or ulcerations noted. HEENT: Head: normal shape and size; Eyes: sclera white, no icterus, conjunctiva pink, PERRLA and EOMs intact; Ears: Tm's gray and intact, normal light reflex; Nose: mucosa pink and moist, septum midline; Throat/Mouth: Teeth present, mucosa pink and moist, no exudate, lesions or ulcerations noted.  Neck:  Neck supple, trachea midline. No  masses, lumps or thyromegaly present.  Cardiovascular: Normal rate and rhythm. S1,S2 noted.  No murmur, rubs or gallops noted. No JVD or BLE edema. No carotid bruits noted. Pulmonary/Chest: Normal effort and positive vesicular breath sounds. No respiratory distress. No wheezes, rales or ronchi noted.  Abdomen: Soft and nontender. Normal bowel sounds. No distention or masses noted. Liver, spleen and kidneys non palpable. Musculoskeletal: Normal range of motion. No signs of joint swelling. No difficulty with gait.  Neurological: Alert and oriented. Cranial nerves II-XII grossly intact. Coordination normal.  Psychiatric: Mood and affect normal. Behavior is normal. Judgment and thought content normal.    BMET    Component Value Date/Time   NA 138 04/02/2023 1142   NA 140 05/05/2014 1100   K 4.0 04/02/2023 1142   K 4.0 05/05/2014 1100   CL 105 04/02/2023 1142   CL 106 05/05/2014 1100   CO2 26 04/02/2023 1142   CO2 25 05/05/2014 1100   GLUCOSE 166 (H) 04/02/2023 1142   GLUCOSE 117 (H) 05/05/2014 1100   BUN 9 04/02/2023 1142   BUN 8 05/05/2014 1100   CREATININE 0.55 04/02/2023 1142   CREATININE 0.60 06/23/2021 1027   CALCIUM 8.8 (L) 04/02/2023 1142   CALCIUM 8.4 (L) 05/05/2014 1100   GFRNONAA >60 04/02/2023 1142   GFRNONAA >60 05/05/2014 1100   GFRNONAA >60 11/09/2013 1854   GFRAA >60 05/05/2014 1100   GFRAA >60 11/09/2013 1854    Lipid Panel     Component Value Date/Time   CHOL 157 06/23/2021 1027   TRIG 115 06/23/2021 1027   HDL 42 (L) 06/23/2021 1027   CHOLHDL 3.7 06/23/2021 1027   LDLCALC 94 06/23/2021 1027    CBC    Component Value Date/Time   WBC 7.4 04/02/2023 1142   RBC 4.57 04/02/2023 1142   HGB 9.6 (L) 04/02/2023 1142   HGB 9.8 (L) 05/05/2014 1100   HCT 32.6 (L) 04/02/2023 1142   HCT 31.2 (L) 05/05/2014 1100   PLT 481 (H) 04/02/2023 1142   PLT 455 (H) 05/05/2014 1100   MCV 71.3 (L) 04/02/2023 1142   MCV 69 (L) 05/05/2014 1100   MCH 21.0 (L) 04/02/2023 1142    MCHC 29.4 (L) 04/02/2023 1142   RDW 20.1 (H) 04/02/2023 1142   RDW 20.4 (H) 05/05/2014 1100   LYMPHSABS 2.2 04/02/2023 1142   LYMPHSABS 1.9 11/09/2013 1854   MONOABS 0.6 04/02/2023 1142   MONOABS 0.5 11/09/2013 1854   EOSABS 0.8 (H) 04/02/2023 1142   EOSABS 0.6 11/09/2013 1854   BASOSABS 0.1 04/02/2023 1142   BASOSABS 0.1 11/09/2013 1854    Hgb A1C Lab Results  Component Value Date   HGBA1C 11.1 (H) 06/23/2021            Assessment & Plan:   Preventative health maintenance:  Flu Tetanus UTD Encouraged her to get her COVID-vaccine Pneumovax Pap smear Encouraged her to consume a balanced  diet and exercise regimen Advised her to see an eye doctor and dentist annually We will check CBC, c-Met, lipid, A1c, urine microalbumin, HIV and hep C today  RTC in 3 months, follow-up chronic conditions Nicki Reaper, NP

## 2023-07-01 ENCOUNTER — Encounter: Payer: Self-pay | Admitting: Internal Medicine

## 2023-07-01 ENCOUNTER — Ambulatory Visit (INDEPENDENT_AMBULATORY_CARE_PROVIDER_SITE_OTHER): Payer: BLUE CROSS/BLUE SHIELD | Admitting: Internal Medicine

## 2023-07-01 VITALS — BP 150/103 | Ht 67.0 in | Wt 368.0 lb

## 2023-07-01 DIAGNOSIS — E1165 Type 2 diabetes mellitus with hyperglycemia: Secondary | ICD-10-CM

## 2023-07-01 DIAGNOSIS — I10 Essential (primary) hypertension: Secondary | ICD-10-CM | POA: Diagnosis not present

## 2023-07-01 DIAGNOSIS — Z7984 Long term (current) use of oral hypoglycemic drugs: Secondary | ICD-10-CM

## 2023-07-01 DIAGNOSIS — Z1159 Encounter for screening for other viral diseases: Secondary | ICD-10-CM | POA: Diagnosis not present

## 2023-07-01 DIAGNOSIS — Z0001 Encounter for general adult medical examination with abnormal findings: Secondary | ICD-10-CM | POA: Diagnosis not present

## 2023-07-01 DIAGNOSIS — Z124 Encounter for screening for malignant neoplasm of cervix: Secondary | ICD-10-CM

## 2023-07-01 DIAGNOSIS — Z114 Encounter for screening for human immunodeficiency virus [HIV]: Secondary | ICD-10-CM

## 2023-07-01 MED ORDER — AMLODIPINE-OLMESARTAN 10-40 MG PO TABS: 1.0000 | ORAL_TABLET | Freq: Every day | ORAL | 0 refills | Status: DC

## 2023-07-01 MED ORDER — TIRZEPATIDE 2.5 MG/0.5ML ~~LOC~~ SOAJ: 2.5000 mg | SUBCUTANEOUS | 0 refills | Status: DC

## 2023-07-01 MED ORDER — GLIPIZIDE 10 MG PO TABS: 10.0000 mg | ORAL_TABLET | Freq: Two times a day (BID) | ORAL | 0 refills | Status: DC

## 2023-07-01 NOTE — Assessment & Plan Note (Signed)
Will start amlodipine-losartan 10-40 mg daily Reinforced DASH diet and exercise for weight loss C-Met today

## 2023-07-01 NOTE — Assessment & Plan Note (Signed)
Will trial [employer], low-carb diet and exercise for weight loss

## 2023-07-01 NOTE — Progress Notes (Signed)
Subjective:    Patient ID: LABRENDA LASKY, female    DOB: 1991-01-19, 33 y.o.   MRN: [id number]  HPI  Patient presents to clinic today for her annual exam. Of note, her BP today is 150/103. She is not taking any antihypertensive medications at this time.   Flu: Never Tetanus: 09/2020 COVID: Never Pneumovax: Never Pap smear: 2021 Vision screening: annually Dentist: as needed  Diet: She does eat meat. She consumes fruits and veggies. She tries to avoid fried foods. She drinks mostly soda, juice, and tea. Exercise: gym   Review of Systems   No past medical history on file.  Current Outpatient Medications  Medication Sig Dispense Refill   glipiZIDE (GLUCOTROL) 10 MG tablet Take 1 tablet (10 mg total) by mouth 2 (two) times daily before a meal. 180 tablet 1   No current facility-administered medications for this visit.    No Known Allergies  Family History  Problem Relation Age of Onset   Diabetes Mother    Parkinson's disease Mother    Irregular heart beat Mother    Healthy Father    Healthy Sister    Diabetes Paternal Grandmother    Breast cancer Other    Colon cancer Neg Hx     Social History   Socioeconomic History   Marital status: Married    Spouse name: Not on file   Number of children: Not on file   Years of education: Not on file   Highest education level: Not on file  Occupational History   Not on file  Tobacco Use   Smoking status: Every Day    Types: Cigarettes   Smokeless tobacco: Never  Vaping Use   Vaping status: Never Used  Substance and Sexual Activity   Alcohol use: Not Currently   Drug use: Never   Sexual activity: Not on file  Other Topics Concern   Not on file  Social History Narrative   Not on file   Social Drivers of Health   Financial Resource Strain: Not on file  Food Insecurity: No Food Insecurity (04/02/2023)   Hunger Vital Sign    Worried About Running Out of Food in the Last Year: Never true    Ran Out of Food in  the Last Year: Never true  Transportation Needs: Not on file  Physical Activity: Not on file  Stress: Not on file  Social Connections: Not on file  Intimate Partner Violence: Not At Risk (04/02/2023)   Humiliation, Afraid, Rape, and Kick questionnaire    Fear of Current or Ex-Partner: No    Emotionally Abused: No    Physically Abused: No    Sexually Abused: No     Constitutional: Denies fever, malaise, fatigue, headache or abrupt weight changes.  HEENT: Denies eye pain, eye redness, ear pain, ringing in the ears, wax buildup, runny nose, nasal congestion, bloody nose, or sore throat. Respiratory: Denies difficulty breathing, shortness of breath, cough or sputum production.   Cardiovascular: Denies chest pain, chest tightness, palpitations or swelling in the hands or feet.  Gastrointestinal: Denies abdominal pain, bloating, constipation, diarrhea or blood in the stool.  GU: Denies urgency, frequency, pain with urination, burning sensation, blood in urine, odor or discharge. Musculoskeletal: Denies decrease in range of motion, difficulty with gait, muscle pain or joint pain and swelling.  Skin: Denies redness, rashes, lesions or ulcercations.  Neurological: Patient reports insomnia.  Denies dizziness, difficulty with memory, difficulty with speech or problems with balance and coordination.  Psych: Denies  anxiety, depression, SI/HI.  No other specific complaints in a complete review of systems (except as listed in HPI above).      Objective:   Physical Exam BP (!) 150/103 (BP Location: Right Wrist, Patient Position: Sitting, Cuff Size: Normal)   Ht 5\' 7"  (1.702 m)   Wt (!) 368 lb (166.9 kg)   LMP 06/24/2023 (Approximate)   BMI 57.64 kg/m   Wt Readings from Last 3 Encounters:  04/02/23 (!) 361 lb (163.7 kg)  03/12/23 (!) 369 lb (167.4 kg)  08/20/21 (!) 365 lb 1.3 oz (165.6 kg)    General: Appears her stated age, obese in NAD. Skin: Warm, dry and intact. No ulcerations  noted. HEENT: Head: normal shape and size; Eyes: sclera white, no icterus, conjunctiva pink, PERRLA and EOMs intact;  Neck:  Neck supple, trachea midline. No masses, lumps or thyromegaly present.  Cardiovascular: Normal rate and rhythm. S1,S2 noted.  No murmur, rubs or gallops noted. No JVD or BLE edema.  Pulmonary/Chest: Normal effort and positive vesicular breath sounds. No respiratory distress. No wheezes, rales or ronchi noted.  Abdomen: Soft and nontender. Normal bowel sounds.  Musculoskeletal: Strength 5/5 BUE/BLE.  No difficulty with gait.  Neurological: Alert and oriented. Cranial nerves II-XII grossly intact. Coordination normal.  Psychiatric: Mood and affect normal. Behavior is normal. Judgment and thought content normal.    BMET    Component Value Date/Time   NA 138 04/02/2023 1142   NA 140 05/05/2014 1100   K 4.0 04/02/2023 1142   K 4.0 05/05/2014 1100   CL 105 04/02/2023 1142   CL 106 05/05/2014 1100   CO2 26 04/02/2023 1142   CO2 25 05/05/2014 1100   GLUCOSE 166 (H) 04/02/2023 1142   GLUCOSE 117 (H) 05/05/2014 1100   BUN 9 04/02/2023 1142   BUN 8 05/05/2014 1100   CREATININE 0.55 04/02/2023 1142   CREATININE 0.60 06/23/2021 1027   CALCIUM 8.8 (L) 04/02/2023 1142   CALCIUM 8.4 (L) 05/05/2014 1100   GFRNONAA >60 04/02/2023 1142   GFRNONAA >60 05/05/2014 1100   GFRNONAA >60 11/09/2013 1854   GFRAA >60 05/05/2014 1100   GFRAA >60 11/09/2013 1854    Lipid Panel     Component Value Date/Time   CHOL 157 06/23/2021 1027   TRIG 115 06/23/2021 1027   HDL 42 (L) 06/23/2021 1027   CHOLHDL 3.7 06/23/2021 1027   LDLCALC 94 06/23/2021 1027    CBC    Component Value Date/Time   WBC 7.4 04/02/2023 1142   RBC 4.57 04/02/2023 1142   HGB 9.6 (L) 04/02/2023 1142   HGB 9.8 (L) 05/05/2014 1100   HCT 32.6 (L) 04/02/2023 1142   HCT 31.2 (L) 05/05/2014 1100   PLT 481 (H) 04/02/2023 1142   PLT 455 (H) 05/05/2014 1100   MCV 71.3 (L) 04/02/2023 1142   MCV 69 (L) 05/05/2014  1100   MCH 21.0 (L) 04/02/2023 1142   MCHC 29.4 (L) 04/02/2023 1142   RDW 20.1 (H) 04/02/2023 1142   RDW 20.4 (H) 05/05/2014 1100   LYMPHSABS 2.2 04/02/2023 1142   LYMPHSABS 1.9 11/09/2013 1854   MONOABS 0.6 04/02/2023 1142   MONOABS 0.5 11/09/2013 1854   EOSABS 0.8 (H) 04/02/2023 1142   EOSABS 0.6 11/09/2013 1854   BASOSABS 0.1 04/02/2023 1142   BASOSABS 0.1 11/09/2013 1854    Hgb A1C Lab Results  Component Value Date   HGBA1C 11.1 (H) 06/23/2021            Assessment &  Plan:   Preventative health maintenance:  Flu shot declined Tetanus UTD Encouraged her to get her COVID-vaccine Pneumovax declined Referral to GYN for Pap smear Pap smear Encouraged her to consume a balanced diet and exercise regimen Advised her to see an eye doctor and dentist annually We will check CBC, c-Met, lipid, A1c, urine microalbumin, HIV and hep C today  RTC in 2 weeks follow-up HTN, 3 months, follow-up chronic conditions Nicki Reaper, NP

## 2023-07-01 NOTE — Patient Instructions (Signed)

## 2023-07-02 ENCOUNTER — Encounter: Payer: Self-pay | Admitting: Internal Medicine

## 2023-07-02 LAB — LIPID PANEL
Cholesterol: 150 mg/dL (ref <200)
HDL: 47 mg/dL — ABNORMAL LOW (ref >=50)
LDL Cholesterol (Calc): 87 mg/dL
Non-HDL Cholesterol (Calc): 103 mg/dL (ref <130)
Total CHOL/HDL Ratio: 3.2 (calc) (ref <5.0)
Triglycerides: 71 mg/dL (ref <150)

## 2023-07-02 LAB — HEMOGLOBIN A1C
Hgb A1c MFr Bld: 10.1 %{Hb} — ABNORMAL HIGH (ref <5.7)
Mean Plasma Glucose: 243 mg/dL
eAG (mmol/L): 13.5 mmol/L

## 2023-07-02 LAB — COMPLETE METABOLIC PANEL WITH GFR
AG Ratio: 1.3 (calc) (ref 1.0–2.5)
ALT: 16 U/L (ref 6–29)
AST: 14 U/L (ref 10–30)
Albumin: 3.9 g/dL (ref 3.6–5.1)
Alkaline phosphatase (APISO): 77 U/L (ref 31–125)
BUN: 8 mg/dL (ref 7–25)
CO2: 26 mmol/L (ref 20–32)
Calcium: 9 mg/dL (ref 8.6–10.2)
Chloride: 100 mmol/L (ref 98–110)
Creat: 0.58 mg/dL (ref 0.50–0.97)
Globulin: 3.1 g/dL (ref 1.9–3.7)
Glucose, Bld: 244 mg/dL — ABNORMAL HIGH (ref 65–139)
Potassium: 4.6 mmol/L (ref 3.5–5.3)
Sodium: 137 mmol/L (ref 135–146)
Total Bilirubin: 0.2 mg/dL (ref 0.2–1.2)
Total Protein: 7 g/dL (ref 6.1–8.1)
eGFR: 122 mL/min/{1.73_m2} (ref >=60)

## 2023-07-02 LAB — CBC
HCT: 38.3 % (ref 35.0–45.0)
Hemoglobin: 12.4 g/dL (ref 11.7–15.5)
MCH: 26.3 pg — ABNORMAL LOW (ref 27.0–33.0)
MCHC: 32.4 g/dL (ref 32.0–36.0)
MCV: 81.3 fL (ref 80.0–100.0)
MPV: 10.2 fL (ref 7.5–12.5)
Platelets: 367 10*3/uL (ref 140–400)
RBC: 4.71 10*6/uL (ref 3.80–5.10)
RDW: 20.7 % — ABNORMAL HIGH (ref 11.0–15.0)
WBC: 5.8 10*3/uL (ref 3.8–10.8)

## 2023-07-02 LAB — MICROALBUMIN / CREATININE URINE RATIO
Creatinine, Urine: 174 mg/dL (ref 20–275)
Microalb Creat Ratio: 77 mg/g{creat} — ABNORMAL HIGH (ref <30)
Microalb, Ur: 13.4 mg/dL

## 2023-07-02 LAB — HIV ANTIBODY (ROUTINE TESTING W REFLEX): HIV 1&2 Ab, 4th Generation: NONREACTIVE

## 2023-07-02 LAB — HEPATITIS C ANTIBODY: Hepatitis C Ab: NONREACTIVE

## 2023-07-16 ENCOUNTER — Encounter: Payer: Self-pay | Admitting: Obstetrics and Gynecology

## 2023-07-26 ENCOUNTER — Encounter: Payer: Self-pay | Admitting: Internal Medicine

## 2023-07-30 ENCOUNTER — Encounter: Payer: Self-pay | Admitting: Internal Medicine

## 2023-07-30 ENCOUNTER — Inpatient Hospital Stay: Payer: BLUE CROSS/BLUE SHIELD

## 2023-07-30 ENCOUNTER — Inpatient Hospital Stay: Payer: BLUE CROSS/BLUE SHIELD | Admitting: Internal Medicine

## 2023-07-30 ENCOUNTER — Inpatient Hospital Stay: Payer: BLUE CROSS/BLUE SHIELD | Attending: Internal Medicine

## 2023-07-30 VITALS — BP 126/82 | HR 89 | Temp 97.8°F | Resp 16 | Wt 368.0 lb

## 2023-07-30 DIAGNOSIS — D500 Iron deficiency anemia secondary to blood loss (chronic): Secondary | ICD-10-CM | POA: Diagnosis not present

## 2023-07-30 DIAGNOSIS — I10 Essential (primary) hypertension: Secondary | ICD-10-CM | POA: Diagnosis not present

## 2023-07-30 DIAGNOSIS — D509 Iron deficiency anemia, unspecified: Secondary | ICD-10-CM | POA: Insufficient documentation

## 2023-07-30 DIAGNOSIS — Z79899 Other long term (current) drug therapy: Secondary | ICD-10-CM | POA: Diagnosis not present

## 2023-07-30 DIAGNOSIS — D649 Anemia, unspecified: Secondary | ICD-10-CM

## 2023-07-30 DIAGNOSIS — E119 Type 2 diabetes mellitus without complications: Secondary | ICD-10-CM | POA: Diagnosis not present

## 2023-07-30 DIAGNOSIS — Z7984 Long term (current) use of oral hypoglycemic drugs: Secondary | ICD-10-CM | POA: Insufficient documentation

## 2023-07-30 DIAGNOSIS — F17210 Nicotine dependence, cigarettes, uncomplicated: Secondary | ICD-10-CM | POA: Diagnosis not present

## 2023-07-30 DIAGNOSIS — Z803 Family history of malignant neoplasm of breast: Secondary | ICD-10-CM | POA: Diagnosis not present

## 2023-07-30 DIAGNOSIS — K5900 Constipation, unspecified: Secondary | ICD-10-CM | POA: Diagnosis not present

## 2023-07-30 LAB — CBC WITH DIFFERENTIAL (CANCER CENTER ONLY)
Abs Immature Granulocytes: 0.01 10*3/uL (ref 0.00–0.07)
Basophils Absolute: 0.1 10*3/uL (ref 0.0–0.1)
Basophils Relative: 1 %
Eosinophils Absolute: 0.6 10*3/uL — ABNORMAL HIGH (ref 0.0–0.5)
Eosinophils Relative: 8 %
HCT: 40.4 % (ref 36.0–46.0)
Hemoglobin: 13.1 g/dL (ref 12.0–15.0)
Immature Granulocytes: 0 %
Lymphocytes Relative: 32 %
Lymphs Abs: 2.1 10*3/uL (ref 0.7–4.0)
MCH: 26.5 pg (ref 26.0–34.0)
MCHC: 32.4 g/dL (ref 30.0–36.0)
MCV: 81.6 fL (ref 80.0–100.0)
Monocytes Absolute: 0.5 10*3/uL (ref 0.1–1.0)
Monocytes Relative: 8 %
Neutro Abs: 3.4 10*3/uL (ref 1.7–7.7)
Neutrophils Relative %: 51 %
Platelet Count: 352 10*3/uL (ref 150–400)
RBC: 4.95 MIL/uL (ref 3.87–5.11)
RDW: 16.8 % — ABNORMAL HIGH (ref 11.5–15.5)
WBC Count: 6.6 10*3/uL (ref 4.0–10.5)
nRBC: 0 % (ref 0.0–0.2)

## 2023-07-30 LAB — FERRITIN: Ferritin: 12 ng/mL (ref 11–307)

## 2023-07-30 LAB — IRON AND TIBC
Iron: 46 ug/dL (ref 28–170)
Saturation Ratios: 10 % — ABNORMAL LOW (ref 10.4–31.8)
TIBC: 441 ug/dL (ref 250–450)
UIBC: 395 ug/dL

## 2023-07-30 MED ORDER — SENNA 8.6 MG PO TABS: 2.0000 | ORAL_TABLET | Freq: Every day | ORAL | 0 refills | Status: DC | PRN

## 2023-07-30 NOTE — Progress Notes (Signed)
 Patient feels the 5 infusions really helped, she says that she is just now starting to feel fatigued and tired again. She has been having an issue with sleeping for a while now, it has got really bad to were she wants to see if there is something that you recommend that would help. Her constipation has also got worse, she would like to discuss getting a prescription, it has been since last Friday since having bowel movement.

## 2023-07-30 NOTE — Progress Notes (Signed)
 [facility]  [phone] [phone]  ID: Vail Basista Tumbleson OB: Sep 04, 1990  MR#: [id number]  HKV#:[id number]  Patient Care Team: Lorre Munroe, NP as PCP - General (Internal Medicine) Michaelyn Barter, MD as Consulting Physician (Oncology)  Reason for visit-iron deficiency anemia  HPI: Sandra Hays is a 33 y.o. female with past medical history of iron deficiency anemia referred to hematology for management of anemia.  Patient reports longstanding history of iron deficiency.  Reports intolerance to oral iron causing constipation and upset stomach. Reports irregular menstrual cycles, heavy bleeding 2 weeks at a time.  Has to change pads every hour.  Has previously seen OB/GYN about a year or 2 ago. Denies any prior endoscopy or colonoscopy.  Denies any gastric bypass surgery. Uses ibuprofen occasionally.  Interval history Patient was seen today as follow-up accompanied with significant other for iron deficiency anemia and labs. Reports improvement in her energy level after iron infusions.  Now starting to feel tired again.  Reports constipation which is new for her.  Last bowel movement was about a week ago.  Denies any nausea, vomiting, abdominal pain. Reports after the iron infusions, her menstrual cycles have lightened up.  She changes pads 3-4 times a day.  Previously it was every hour.  REVIEW OF SYSTEMS:   ROS  As per HPI. Otherwise, a complete review of systems is negative.  PAST MEDICAL HISTORY: History reviewed. No pertinent past medical history.  PAST SURGICAL HISTORY: Past Surgical History:  Procedure Laterality Date   NO PAST SURGERIES      FAMILY HISTORY: Family History  Problem Relation Age of Onset   Diabetes Mother    Parkinson's disease Mother    Irregular heart beat Mother    Healthy Father    Healthy Sister    Diabetes Paternal Grandmother    Breast cancer Other    Colon cancer Neg Hx     HEALTH  MAINTENANCE: Social History   Tobacco Use   Smoking status: Every Day    Types: Cigarettes   Smokeless tobacco: Never  Vaping Use   Vaping status: Never Used  Substance Use Topics   Alcohol use: Not Currently   Drug use: Never     No Known Allergies  Current Outpatient Medications  Medication Sig Dispense Refill   amLODipine-olmesartan (AZOR) 10-40 MG tablet Take 1 tablet by mouth daily. 90 tablet 0   glipiZIDE (GLUCOTROL) 10 MG tablet Take 1 tablet (10 mg total) by mouth 2 (two) times daily before a meal. 180 tablet 0   senna (SENOKOT) 8.6 MG TABS tablet Take 2 tablets (17.2 mg total) by mouth daily as needed for mild constipation. 60 tablet 0   tirzepatide (MOUNJARO) 2.5 MG/0.5ML Pen Inject 2.5 mg into the skin once a week. (Patient not taking: Reported on 07/30/2023) 2 mL 0   No current facility-administered medications for this visit.    OBJECTIVE: Vitals:   07/30/23 0958  BP: 126/82  Pulse: 89  Resp: 16  Temp: 97.8 F (36.6 C)  SpO2: 98%     Body mass index is 57.64 kg/m.      General: Well-developed, well-nourished, no acute distress. Eyes: Pink conjunctiva, anicteric sclera. HEENT: Normocephalic, moist mucous membranes, clear oropharnyx. Lungs: Clear to auscultation bilaterally. Heart: Regular rate and rhythm. No rubs, murmurs, or gallops. Abdomen: Soft, nontender, nondistended. No organomegaly noted, normoactive bowel sounds. Musculoskeletal: No edema, cyanosis, or clubbing. Neuro: Alert, answering all questions appropriately. Cranial nerves grossly intact. Skin: No rashes  or petechiae noted. Psych: Normal affect. Lymphatics: No cervical, calvicular, axillary or inguinal LAD.   LAB RESULTS:  Lab Results  Component Value Date   NA 137 07/01/2023   K 4.6 07/01/2023   CL 100 07/01/2023   CO2 26 07/01/2023   GLUCOSE 244 (H) 07/01/2023   BUN 8 07/01/2023   CREATININE 0.58 07/01/2023   CALCIUM 9.0 07/01/2023   PROT 7.0 07/01/2023   ALBUMIN 3.5  04/02/2023   AST 14 07/01/2023   ALT 16 07/01/2023   ALKPHOS 71 04/02/2023   BILITOT 0.2 07/01/2023   GFRNONAA >60 04/02/2023   GFRAA >60 05/05/2014    Lab Results  Component Value Date   WBC 6.6 07/30/2023   NEUTROABS 3.4 07/30/2023   HGB 13.1 07/30/2023   HCT 40.4 07/30/2023   MCV 81.6 07/30/2023   PLT 352 07/30/2023    Lab Results  Component Value Date   TIBC 508 (H) 04/02/2023   FERRITIN 6 (L) 04/02/2023   IRONPCTSAT 7 (L) 04/02/2023     STUDIES: No results found.  ASSESSMENT AND PLAN:   Sandra Hays is a 33 y.o. female with pmh of iron deficiency anemia referred to hematology for management of anemia.  # Iron deficiency anemia -Chronic since 2014.  Likely secondary to heavy menstrual cycles. Has previously seen OB/GYN.    -Intolerant to oral iron-constipation, upset stomach.  -Completed IV Venofer weekly x 5 doses in December 2024.  Hemoglobin has improved from 9.6-13.1 today.  Hold iron infusion today.  Iron panel is pending.  If ferritin is still low, we will schedule her for more iron infusions.  Reports for the past couple of months, her menstrual cycles have lightened.  Discussed if she gets anemic again with light menstrual cycles, we may have to look into GI referral for endoscopy and colonoscopy.  Patient agreeable.  # Constipation -New.  Took milk of magnesium with mild improvement. -Advised to do MiraLAX once to twice a day as needed.  Sent prescription for Senokot 1-2 tabs daily as needed.  Patient was advised if she does not have bowel movement in the next few days, to discuss with PCP for further recommendations.  # Hypertension-on amlodipine and olmesartan  # Diabetes-on glipizide  Orders Placed This Encounter  Procedures   Iron and TIBC([facility])   Ferritin   CBC with Differential (Cancer Center Only)   RTC in 6 months for MD visit, labs, venofer.  Patient expressed understanding and was in agreement with this plan. She also  understands that She can call clinic at any time with any questions, concerns, or complaints.   I spent a total of 25 minutes reviewing chart data, face-to-face evaluation with the patient, counseling and coordination of care as detailed above.  Michaelyn Barter, MD   07/30/2023 10:43 AM

## 2023-07-30 NOTE — Patient Instructions (Addendum)
 Consider Miralax twice a day as needed. Available over the counter   Improve your hydration.

## 2023-08-02 ENCOUNTER — Encounter: Payer: Self-pay | Admitting: Internal Medicine

## 2023-08-02 ENCOUNTER — Telehealth: Payer: Self-pay

## 2023-08-02 NOTE — Telephone Encounter (Signed)
 Sandra Hays (Key: [id number]) Rx #: [id number] Need Help? Call us at [phone] Status New (Not sent to plan) Drug Mounjaro 2.5MG /0.5ML auto-injectors ePA cloud logo Form [employer] Electronic Prior Authorization Form [phone] NCPDP) Original Claim Info 84

## 2023-08-02 NOTE — Telephone Encounter (Signed)
 Did we note in the PA that she had taken metformin in the past?

## 2023-08-06 ENCOUNTER — Encounter: Payer: Self-pay | Admitting: Internal Medicine

## 2023-08-06 ENCOUNTER — Inpatient Hospital Stay

## 2023-08-06 VITALS — BP 136/87 | HR 79 | Temp 96.5°F | Resp 18

## 2023-08-06 DIAGNOSIS — D500 Iron deficiency anemia secondary to blood loss (chronic): Secondary | ICD-10-CM

## 2023-08-06 DIAGNOSIS — D509 Iron deficiency anemia, unspecified: Secondary | ICD-10-CM | POA: Diagnosis not present

## 2023-08-06 MED ORDER — SODIUM CHLORIDE 0.9% FLUSH
10.0000 mL | Freq: Two times a day (BID) | INTRAVENOUS | Status: DC
  Administered 2023-08-06: 10 mL via INTRAVENOUS
  Filled 2023-08-06: qty 10

## 2023-08-06 MED ORDER — IRON SUCROSE 20 MG/ML IV SOLN
200.0000 mg | Freq: Once | INTRAVENOUS | Status: AC
  Administered 2023-08-06: 200 mg via INTRAVENOUS
  Filled 2023-08-06: qty 10

## 2023-08-06 NOTE — Addendum Note (Signed)
 Addended byMichaelyn Barter on: 08/06/2023 09:30 AM   Modules accepted: Orders

## 2023-08-13 ENCOUNTER — Inpatient Hospital Stay

## 2023-08-20 ENCOUNTER — Inpatient Hospital Stay: Attending: Internal Medicine

## 2023-08-20 VITALS — BP 140/71 | HR 93 | Temp 97.8°F | Resp 16

## 2023-08-20 DIAGNOSIS — D500 Iron deficiency anemia secondary to blood loss (chronic): Secondary | ICD-10-CM

## 2023-08-20 DIAGNOSIS — D509 Iron deficiency anemia, unspecified: Secondary | ICD-10-CM | POA: Diagnosis present

## 2023-08-20 MED ORDER — IRON SUCROSE 20 MG/ML IV SOLN
200.0000 mg | Freq: Once | INTRAVENOUS | Status: AC
  Administered 2023-08-20: 200 mg via INTRAVENOUS
  Filled 2023-08-20: qty 10

## 2023-09-29 ENCOUNTER — Emergency Department
Admission: EM | Admit: 2023-09-29 | Discharge: 2023-09-29 | Disposition: A | Discharge status: Home / Self Care | Attending: Emergency Medicine | Admitting: Emergency Medicine

## 2023-09-29 ENCOUNTER — Emergency Department

## 2023-09-29 ENCOUNTER — Other Ambulatory Visit: Payer: Self-pay

## 2023-09-29 DIAGNOSIS — Y92410 Unspecified street and highway as the place of occurrence of the external cause: Secondary | ICD-10-CM | POA: Insufficient documentation

## 2023-09-29 DIAGNOSIS — M25511 Pain in right shoulder: Secondary | ICD-10-CM

## 2023-09-29 MED ORDER — CYCLOBENZAPRINE HCL 10 MG PO TABS: 10.0000 mg | ORAL_TABLET | Freq: Three times a day (TID) | ORAL | 0 refills | Status: AC | PRN

## 2023-09-29 MED ORDER — IBUPROFEN 800 MG PO TABS
800.0000 mg | ORAL_TABLET | Freq: Once | ORAL | Status: AC
  Administered 2023-09-29: 800 mg via ORAL
  Filled 2023-09-29: qty 1

## 2023-09-29 MED ORDER — MELOXICAM 15 MG PO TABS: 15.0000 mg | ORAL_TABLET | Freq: Every day | ORAL | 0 refills | Status: AC

## 2023-09-29 NOTE — Discharge Instructions (Addendum)
 Been diagnosed with right shoulder sprain, please take Flexeril 1 tablet by mouth every 8 hours after main meals.  Please take meloxicam 1 tablet with breakfast.  You can apply warm compresses in the right shoulder every 3 hours for 20 minutes.  Please come back to ED or go to your PCP if you have new symptoms or symptoms worsen
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
## 2023-10-06 ENCOUNTER — Ambulatory Visit: Payer: BLUE CROSS/BLUE SHIELD | Admitting: Internal Medicine

## 2023-10-09 ENCOUNTER — Encounter: Payer: Self-pay | Admitting: Internal Medicine

## 2023-10-09 ENCOUNTER — Emergency Department
Admission: EM | Admit: 2023-10-09 | Discharge: 2023-10-09 | Disposition: A | Discharge status: Home / Self Care | Payer: Worker's Compensation | Attending: Emergency Medicine | Admitting: Emergency Medicine

## 2023-10-09 ENCOUNTER — Other Ambulatory Visit: Payer: Self-pay

## 2023-10-09 ENCOUNTER — Encounter: Payer: Self-pay | Admitting: Emergency Medicine

## 2023-10-09 ENCOUNTER — Emergency Department: Payer: Worker's Compensation

## 2023-10-09 DIAGNOSIS — W010XXA Fall on same level from slipping, tripping and stumbling without subsequent striking against object, initial encounter: Secondary | ICD-10-CM | POA: Insufficient documentation

## 2023-10-09 DIAGNOSIS — E119 Type 2 diabetes mellitus without complications: Secondary | ICD-10-CM | POA: Insufficient documentation

## 2023-10-09 DIAGNOSIS — I10 Essential (primary) hypertension: Secondary | ICD-10-CM | POA: Diagnosis not present

## 2023-10-09 DIAGNOSIS — M25561 Pain in right knee: Secondary | ICD-10-CM | POA: Diagnosis present

## 2023-10-09 DIAGNOSIS — G8911 Acute pain due to trauma: Secondary | ICD-10-CM | POA: Diagnosis not present

## 2023-10-09 DIAGNOSIS — Y990 Civilian activity done for income or pay: Secondary | ICD-10-CM | POA: Insufficient documentation

## 2023-10-09 NOTE — Discharge Instructions (Addendum)
 The xray of your knee was normal.  You can take 650 mg of Tylenol  and 600 mg of ibuprofen  every 6 hours as needed for pain. You can use ice, heat, muscle creams and other topical pain relievers as well.  Follow up with orthopedics as need. Their information is attached. Call to schedule an appointment.

## 2023-10-09 NOTE — ED Notes (Signed)
 Pt presented to ED with c/o right knee pain after slipping in water at work around 3pm. States took tylenol  at [employer] after fall. States tylenol  did not help with pain.

## 2023-10-09 NOTE — ED Provider Notes (Signed)
 [facility] Provider Note    Event Date/Time   First MD Initiated Contact with Patient 10/09/23 2127     (approximate)   History   Knee Pain   HPI  Sandra Hays is a 33 y.o. female with PMH of T2DM, HTN, IDA, morbid obesity who presents for evaluation of right knee pain that began after a mechanical fall at work today. Patient slipped on some water, landing with her leg bent underneath her. She has been able to walk on it since the injury but does have some pain.       Physical Exam   Triage Vital Signs: ED Triage Vitals  Encounter Vitals Group     BP 10/09/23 2047 (!) 151/86     Systolic BP Percentile --      Diastolic BP Percentile --      Pulse Rate 10/09/23 2047 92     Resp 10/09/23 2047 20     Temp 10/09/23 2047 98.5 F (36.9 C)     Temp Source 10/09/23 2047 Oral     SpO2 10/09/23 2047 96 %     Weight 10/09/23 2054 (!) 346 lb (156.9 kg)     Height 10/09/23 2054 5\' 7"  (1.702 m)     Head Circumference --      Peak Flow --      Pain Score 10/09/23 2054 8     Pain Loc --      Pain Education --      Exclude from Growth Chart --     Most recent vital signs: Vitals:   10/09/23 2047  BP: (!) 151/86  Pulse: 92  Resp: 20  Temp: 98.5 F (36.9 C)  SpO2: 96%    General: Awake, no distress.  CV:  Good peripheral perfusion.  Resp:  Normal effort.  Abd:  No distention.  Other:  Right knee does not appear swollen when compared with the left, TTP over the patella, no ligament laxity felt with varus and valgus stress. Negative lachman's. Unable to tolerate anterior and posterior drawer test. ROM maintained but does have pain with movement.   ED Results / Procedures / Treatments   Labs (all labs ordered are listed, but only abnormal results are displayed) Labs Reviewed - No data to display  RADIOLOGY  Right knee xray obtained, I interpreted the images as well as reviewed the radiologist report, negative for any acute abnormalities.     PROCEDURES:  Critical Care performed: No  Procedures   MEDICATIONS ORDERED IN ED: Medications - No data to display   IMPRESSION / MDM / ASSESSMENT AND PLAN / ED COURSE  I reviewed the triage vital signs and the nursing notes.                             33 year old female presents for evaluation of right knee pain after a fall at work.  Blood pressure is elevated patient does have history of hypertension.  Vital signs stable otherwise.  Patient NAD on exam.  Differential diagnosis includes, but is not limited to, joint sprain, muscle strain, fracture, dislocation, ligament injury, meniscus injury.  Patient's presentation is most consistent with acute complicated illness / injury requiring diagnostic workup.  Right knee x-ray negative for any acute abnormalities.  Suspect a joint or muscle strain.  Exam was somewhat limited due to patient's body habitus.  Unable to fully rule out ligament or meniscus injury.  Recommended she follow-up with orthopedics.  Encouraged her to take both Tylenol  and ibuprofen .  She can ice and elevate as needed.  Patient declined crutches.  She was given a note for work.  She voiced understanding, all questions were answered and she is stable at discharge.      FINAL CLINICAL IMPRESSION(S) / ED DIAGNOSES   Final diagnoses:  Acute pain of right knee     Rx / DC Orders   ED Discharge Orders     None        Note:  This document was prepared using Dragon voice recognition software and may include unintentional dictation errors.   Phyliss Breen, PA-C 10/09/23 2232    Bradler, Evan K, MD 10/11/23 [phone]

## 2023-10-09 NOTE — ED Notes (Signed)
Pt discharge information reviewed. Pt understands need for follow up care and when to return if symptoms worsen. All questions answered. Pt is alert and oriented with even and regular respirations. Pt is seen ambulating out of department with strong steady gait.   °

## 2023-10-09 NOTE — ED Triage Notes (Signed)
 Patient reports mechanical fall today at work and has had pain in right knee since. Denies head trauma or blood thinners. Ambulatory after fall with pain.

## 2023-10-25 ENCOUNTER — Ambulatory Visit: Admitting: Internal Medicine

## 2024-01-06 DIAGNOSIS — R062 Wheezing: Secondary | ICD-10-CM | POA: Diagnosis not present

## 2024-01-06 DIAGNOSIS — J4521 Mild intermittent asthma with (acute) exacerbation: Secondary | ICD-10-CM | POA: Diagnosis not present

## 2024-01-31 ENCOUNTER — Inpatient Hospital Stay: Admitting: Internal Medicine

## 2024-01-31 ENCOUNTER — Inpatient Hospital Stay

## 2024-01-31 ENCOUNTER — Other Ambulatory Visit

## 2024-03-24 ENCOUNTER — Observation Stay
Admission: EM | Admit: 2024-03-24 | Discharge: 2024-03-26 | Disposition: A | Discharge status: Home / Self Care | Attending: Hospitalist | Admitting: Hospitalist

## 2024-03-24 ENCOUNTER — Other Ambulatory Visit: Payer: Self-pay

## 2024-03-24 ENCOUNTER — Emergency Department

## 2024-03-24 DIAGNOSIS — Z6841 Body Mass Index (BMI) 40.0 and over, adult: Secondary | ICD-10-CM | POA: Insufficient documentation

## 2024-03-24 DIAGNOSIS — Z794 Long term (current) use of insulin: Secondary | ICD-10-CM | POA: Insufficient documentation

## 2024-03-24 DIAGNOSIS — Z7901 Long term (current) use of anticoagulants: Secondary | ICD-10-CM | POA: Diagnosis not present

## 2024-03-24 DIAGNOSIS — K122 Cellulitis and abscess of mouth: Secondary | ICD-10-CM | POA: Diagnosis not present

## 2024-03-24 DIAGNOSIS — R519 Headache, unspecified: Secondary | ICD-10-CM | POA: Diagnosis present

## 2024-03-24 DIAGNOSIS — I10 Essential (primary) hypertension: Secondary | ICD-10-CM | POA: Diagnosis not present

## 2024-03-24 DIAGNOSIS — Z79899 Other long term (current) drug therapy: Secondary | ICD-10-CM | POA: Diagnosis not present

## 2024-03-24 DIAGNOSIS — E1165 Type 2 diabetes mellitus with hyperglycemia: Secondary | ICD-10-CM | POA: Diagnosis not present

## 2024-03-24 DIAGNOSIS — L0201 Cutaneous abscess of face: Secondary | ICD-10-CM | POA: Diagnosis not present

## 2024-03-24 DIAGNOSIS — L03211 Cellulitis of face: Principal | ICD-10-CM | POA: Diagnosis present

## 2024-03-24 DIAGNOSIS — M272 Inflammatory conditions of jaws: Secondary | ICD-10-CM | POA: Insufficient documentation

## 2024-03-24 DIAGNOSIS — L739 Follicular disorder, unspecified: Secondary | ICD-10-CM | POA: Diagnosis not present

## 2024-03-24 DIAGNOSIS — R590 Localized enlarged lymph nodes: Secondary | ICD-10-CM | POA: Diagnosis not present

## 2024-03-24 HISTORY — DX: Type 2 diabetes mellitus without complications: E11.9

## 2024-03-24 LAB — CBC
HCT: 39.6 % (ref 36.0–46.0)
Hemoglobin: 12.3 g/dL (ref 12.0–15.0)
MCH: 26.1 pg (ref 26.0–34.0)
MCHC: 31.1 g/dL (ref 30.0–36.0)
MCV: 83.9 fL (ref 80.0–100.0)
Platelets: 350 K/uL (ref 150–400)
RBC: 4.72 MIL/uL (ref 3.87–5.11)
RDW: 13.9 % (ref 11.5–15.5)
WBC: 9.2 K/uL (ref 4.0–10.5)
nRBC: 0 % (ref 0.0–0.2)

## 2024-03-24 LAB — BASIC METABOLIC PANEL WITH GFR
Anion gap: 11 (ref 5–15)
BUN: 10 mg/dL (ref 6–20)
CO2: 27 mmol/L (ref 22–32)
Calcium: 8.6 mg/dL — ABNORMAL LOW (ref 8.9–10.3)
Chloride: 97 mmol/L — ABNORMAL LOW (ref 98–111)
Creatinine, Ser: 0.68 mg/dL (ref 0.44–1.00)
GFR, Estimated: >60 mL/min (ref >60)
Glucose, Bld: 381 mg/dL — ABNORMAL HIGH (ref 70–99)
Potassium: 4 mmol/L (ref 3.5–5.1)
Sodium: 135 mmol/L (ref 135–145)

## 2024-03-24 MED ORDER — SODIUM CHLORIDE 0.9 % IV BOLUS
1000.0000 mL | Freq: Once | INTRAVENOUS | Status: AC
  Administered 2024-03-24: 1000 mL via INTRAVENOUS

## 2024-03-24 MED ORDER — IOHEXOL 300 MG/ML  SOLN
75.0000 mL | Freq: Once | INTRAMUSCULAR | Status: AC | PRN
  Administered 2024-03-24: 75 mL via INTRAVENOUS

## 2024-03-24 MED ORDER — SODIUM CHLORIDE 0.9 % IV SOLN
3.0000 g | Freq: Once | INTRAVENOUS | Status: AC
  Administered 2024-03-25: 3 g via INTRAVENOUS
  Filled 2024-03-24: qty 8

## 2024-03-24 NOTE — ED Provider Notes (Signed)
 [facility] Provider Note    Event Date/Time   First MD Initiated Contact with Patient 03/24/24 1935     (approximate)   History   Facial Pain   HPI  Sandra Hays is a 33 year old female with history of T2DM, anemia presenting to the ER for evaluation of facial pain.  Patient reports that a few days ago she noticed some swelling along her lower scalp which she thought may have been related to getting her hair done.  Her first eye was taken out, but since that time she has noticed worsening pain and swelling tracking down her face towards her left jaw.  No voice changes, trouble managing secretions, difficulty swallowing.      Physical Exam   Triage Vital Signs: ED Triage Vitals  Encounter Vitals Group     BP 03/24/24 1635 (!) 151/89     Girls Systolic BP Percentile --      Girls Diastolic BP Percentile --      Boys Systolic BP Percentile --      Boys Diastolic BP Percentile --      Pulse Rate 03/24/24 1635 89     Resp 03/24/24 1635 20     Temp 03/24/24 1633 98.4 F (36.9 C)     Temp Source 03/24/24 1935 Oral     SpO2 03/24/24 1635 95 %     Weight 03/24/24 1633 (!) 325 lb (147.4 kg)     Height 03/24/24 1633 5' 7 (1.702 m)     Head Circumference --      Peak Flow --      Pain Score 03/24/24 1633 8     Pain Loc --      Pain Education --      Exclude from Growth Chart --     Most recent vital signs: Vitals:   03/24/24 2100 03/24/24 2330  BP: 107/73 (!) 151/97  Pulse: 85 86  Resp:    Temp:    SpO2: 100% 99%     General: Awake, interactive  HEENT: There is tenderness to palpation over the left scalp with swelling and pain extending inferiorly behind her ear and into her jaw, no active drainage, no intraoral swelling, no difficulty managing secretions, no swelling underneath the tongue CV:  Good peripheral perfusion Resp:  Unlabored respirations, lungs good auscultation Abd:  Nondistended.  Neuro:  Symmetric facial movement, fluid  speech    ED Results / Procedures / Treatments   Labs (all labs ordered are listed, but only abnormal results are displayed) Labs Reviewed  BASIC METABOLIC PANEL WITH GFR - Abnormal; Notable for the following components:      Result Value   Chloride 97 (*)    Glucose, Bld 381 (*)    Calcium 8.6 (*)    All other components within normal limits  CBC     EKG EKG independently reviewed and interpreted by myself demonstrates:    RADIOLOGY Imaging independently reviewed and interpreted by myself demonstrates:  CT face with contrast demonstrates facial cellulitis along the maxilla and mandible with abscess along the maxilla and prominent lymph nodes in the neck  Formal Radiology Read:  CT Maxillofacial W Contrast Result Date: 03/24/2024 EXAM: CT Face with contrast 03/24/2024 10:21:26 PM TECHNIQUE: CT of the face was performed with the administration of intravenous contrast. Multiplanar reformatted images are provided for review. Automated exposure control, iterative reconstruction, and/or weight based adjustment of the mA/kV was utilized to reduce the radiation dose to as  low as reasonably achievable. COMPARISON: CT face 08/20/2021 CLINICAL HISTORY: Left facial swelling, eval abscess. FINDINGS: AERODIGESTIVE TRACT: No mass. No edema. SALIVARY GLANDS: No acute abnormality. LYMPH NODES: No suspicious cervical lymphadenopathy. SOFT TISSUES: Edema overlying the left maxilla and mandible. Small (4 x 4 mm) hypodensity along the left maxilla in this region (series 3 image 32) is suspicous for small abscess. Mildly prominent left upper neck lymph nodes. BRAIN, ORBITS AND SINUSES: Opacified right anterior ethmoid cells. Otherwise, common sinuses are clear. did BONES: No acute abnormality. No suspicious bone lesion. IMPRESSION: 1. Findings compatible with cellulitis overlying the left maxilla/mandible with suspected small 4 mm abscess along the maxilla. 2. Mildly prominent left upper neck lymph nodes,  nonspecific but likely reactive given the above findings. Electronically signed by: Gilmore Molt MD 03/24/2024 10:43 PM EST RP Workstation: [id number]    PROCEDURES:  Critical Care performed: No  Procedures   MEDICATIONS ORDERED IN ED: Medications  Ampicillin-Sulbactam (UNASYN) 3 g in sodium chloride  0.9 % 100 mL IVPB (has no administration in time range)  sodium chloride  0.9 % bolus 1,000 mL (has no administration in time range)  iohexol (OMNIPAQUE) 300 MG/ML solution 75 mL (75 mLs Intravenous Contrast Given 03/24/24 2213)     IMPRESSION / MDM / ASSESSMENT AND PLAN / ED COURSE  I reviewed the triage vital signs and the nursing notes.  Differential diagnosis includes, but is not limited to, cellulitis, facial abscess, evidence of Ludwig's angina or intraoral swelling   Patient's presentation is most consistent with acute presentation with potential threat to life or bodily function.  33 year old female presenting to the emergency department for evaluation of pain over the left side of her face and jaw.  Stable vitals on presentation.  Labs with reassuring CBC, BMP with hyperglycemia without evidence of DKA.  Does have pain and swelling over large portion of her face.  CT face ordered to further evaluate which is concerning for cellulitis with small area of developing abscess.  Do not think this is large enough to drain.  Not septic, but given location of cellulitis and progression, do think admission for IV antibiotics is reasonable.  Ordered for Unasyn as well as IV fluids here.  Will reach out to hospitalist to discuss admission.      FINAL CLINICAL IMPRESSION(S) / ED DIAGNOSES   Final diagnoses:  Facial cellulitis  Abscess of maxilla     Rx / DC Orders   ED Discharge Orders     None        Note:  This document was prepared using Dragon voice recognition software and may include unintentional dictation errors.   Levander Slate, MD 03/25/24 [phone]

## 2024-03-24 NOTE — ED Triage Notes (Signed)
 Pt to ED for swelling to upper left side of face x2 days. Reports pain to left jaw and ear. Increased pain with palpation

## 2024-03-25 DIAGNOSIS — L03211 Cellulitis of face: Secondary | ICD-10-CM | POA: Diagnosis not present

## 2024-03-25 LAB — GLUCOSE, CAPILLARY
Glucose-Capillary: 353 mg/dL — ABNORMAL HIGH (ref 70–99)
Glucose-Capillary: 275 mg/dL — ABNORMAL HIGH (ref 70–99)
Glucose-Capillary: 347 mg/dL — ABNORMAL HIGH (ref 70–99)
Glucose-Capillary: 243 mg/dL — ABNORMAL HIGH (ref 70–99)

## 2024-03-25 LAB — HEMOGLOBIN A1C
Hgb A1c MFr Bld: 12.5 % — ABNORMAL HIGH (ref 4.8–5.6)
Mean Plasma Glucose: 312.05 mg/dL

## 2024-03-25 MED ORDER — HYDROCODONE-ACETAMINOPHEN 5-325 MG PO TABS
1.0000 | ORAL_TABLET | ORAL | Status: DC | PRN
  Administered 2024-03-25: 2 via ORAL
  Filled 2024-03-25: qty 2

## 2024-03-25 MED ORDER — INSULIN STARTER KIT- PEN NEEDLES (ENGLISH)
1.0000 | Freq: Once | Status: AC
  Administered 2024-03-25: 1
  Filled 2024-03-25: qty 1

## 2024-03-25 MED ORDER — ACETAMINOPHEN 325 MG PO TABS: 650.0000 mg | ORAL_TABLET | Freq: Four times a day (QID) | ORAL | Status: DC | PRN

## 2024-03-25 MED ORDER — INSULIN ASPART 100 UNIT/ML IJ SOLN
0.0000 [IU] | Freq: Three times a day (TID) | INTRAMUSCULAR | Status: DC
  Administered 2024-03-25: 15 [IU] via SUBCUTANEOUS
  Administered 2024-03-25: 20 [IU] via SUBCUTANEOUS
  Administered 2024-03-25 – 2024-03-26 (×2): 11 [IU] via SUBCUTANEOUS
  Administered 2024-03-26: 15 [IU] via SUBCUTANEOUS
  Filled 2024-03-25 (×5): qty 1

## 2024-03-25 MED ORDER — ENOXAPARIN SODIUM 80 MG/0.8ML IJ SOSY
0.5000 mg/kg | PREFILLED_SYRINGE | INTRAMUSCULAR | Status: DC
  Administered 2024-03-25 – 2024-03-26 (×2): 72.5 mg via SUBCUTANEOUS
  Filled 2024-03-25 (×2): qty 0.72

## 2024-03-25 MED ORDER — ALBUTEROL SULFATE (2.5 MG/3ML) 0.083% IN NEBU: 2.5000 mg | INHALATION_SOLUTION | RESPIRATORY_TRACT | Status: DC | PRN

## 2024-03-25 MED ORDER — ONDANSETRON HCL 4 MG PO TABS: 4.0000 mg | ORAL_TABLET | Freq: Four times a day (QID) | ORAL | Status: DC | PRN

## 2024-03-25 MED ORDER — INSULIN GLARGINE-YFGN 100 UNIT/ML ~~LOC~~ SOLN
20.0000 [IU] | Freq: Every day | SUBCUTANEOUS | Status: DC
  Administered 2024-03-25: 20 [IU] via SUBCUTANEOUS
  Filled 2024-03-25 (×2): qty 0.2

## 2024-03-25 MED ORDER — ACETAMINOPHEN 650 MG RE SUPP
650.0000 mg | Freq: Four times a day (QID) | RECTAL | Status: DC | PRN
Start: 2024-03-25 — End: 2024-03-26

## 2024-03-25 MED ORDER — MORPHINE SULFATE (PF) 2 MG/ML IV SOLN: 2.0000 mg | INTRAVENOUS | Status: DC | PRN

## 2024-03-25 MED ORDER — ONDANSETRON HCL 4 MG/2ML IJ SOLN: 4.0000 mg | Freq: Four times a day (QID) | INTRAMUSCULAR | Status: DC | PRN

## 2024-03-25 MED ORDER — LIVING WELL WITH DIABETES BOOK
Freq: Once | Status: AC
  Filled 2024-03-25: qty 1

## 2024-03-25 MED ORDER — HYDROCODONE-ACETAMINOPHEN 5-325 MG PO TABS
1.0000 | ORAL_TABLET | ORAL | Status: DC | PRN
  Administered 2024-03-25 – 2024-03-26 (×4): 1 via ORAL
  Filled 2024-03-25 (×4): qty 1

## 2024-03-25 MED ORDER — KETOROLAC TROMETHAMINE 30 MG/ML IJ SOLN
30.0000 mg | Freq: Four times a day (QID) | INTRAMUSCULAR | Status: DC | PRN
  Administered 2024-03-25 – 2024-03-26 (×3): 30 mg via INTRAVENOUS
  Filled 2024-03-25 (×3): qty 1

## 2024-03-25 MED ORDER — SODIUM CHLORIDE 0.9 % IV SOLN
3.0000 g | Freq: Four times a day (QID) | INTRAVENOUS | Status: DC
  Administered 2024-03-25 – 2024-03-26 (×6): 3 g via INTRAVENOUS
  Filled 2024-03-25 (×7): qty 8

## 2024-03-25 MED ORDER — SODIUM CHLORIDE 0.9 % IV BOLUS (SEPSIS)
1000.0000 mL | Freq: Once | INTRAVENOUS | Status: AC
  Administered 2024-03-25: 1000 mL via INTRAVENOUS

## 2024-03-25 MED ORDER — INSULIN GLARGINE-YFGN 100 UNIT/ML ~~LOC~~ SOLN
10.0000 [IU] | Freq: Every day | SUBCUTANEOUS | Status: DC
Start: 2024-03-25 — End: 2024-03-25

## 2024-03-25 MED ORDER — INSULIN ASPART 100 UNIT/ML IJ SOLN: 0.0000 [IU] | Freq: Every day | INTRAMUSCULAR | Status: DC

## 2024-03-25 NOTE — Assessment & Plan Note (Signed)
 Blood glucose 381 Sliding scale insulin coverage

## 2024-03-25 NOTE — H&P (Signed)
 History and Physical    Patient: Sandra Hays FMW:[id number] DOB: 10-25-1990 DOA: 03/24/2024 DOS: the patient was seen and examined on 03/25/2024 PCP: Antonette Angeline ORN, NP  Patient coming from: Home  Chief Complaint:  Chief Complaint  Patient presents with   Facial Pain    HPI: Sandra Hays is a 33 y.o. 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  Patient had her hair done a week ago and noticed that the braids were very tight and loosened it out however a couple days prior she noted a tender swelling just above her ear on the left.  Over the course of the next 2 days she started having worsening pain and swelling extending down the preauricular area on the left to her left jaw, as well as in the retroauricular area tracking down to the neck.   She denies fever or chills. In the ED, vitals unremarkable, CBC WNL and BMP notable for blood glucose of 381 Maxillofacial CT compatible with cellulitis over left maxilla and mandible with a small 4 mm abscess along the maxilla with prominent left upper neck lymph nodes  Patient given an NS bolus and started on Unasyn  Admission requested     Past Medical History:  Diagnosis Date   Diabetes mellitus without complication (HCC)    Past Surgical History:  Procedure Laterality Date   NO PAST SURGERIES     Social History:  reports that she has been smoking cigarettes. She has never used smokeless tobacco. She reports that she does not currently use alcohol. She reports that she does not use drugs.  No Known Allergies  Family History  Problem Relation Age of Onset   Diabetes Mother    Parkinson's disease Mother    Irregular heart beat Mother    Healthy Father    Healthy Sister    Diabetes Paternal Grandmother    Breast cancer Other    Colon cancer Neg Hx      Prior to Admission medications   Medication Sig Start Date End Date Taking? Authorizing Provider  amLODipine -olmesartan  (AZOR ) 10-40 MG tablet Take 1 tablet by mouth daily. 07/01/23  Yes Antonette Angeline ORN, NP  glipiZIDE  (GLUCOTROL ) 10 MG tablet Take 1 tablet (10 mg total) by mouth 2 (two) times daily before a meal. 07/01/23  Yes Baity, Angeline ORN, NP  guaiFENesin-dextromethorphan (ROBITUSSIN DM) 100-10 MG/5ML syrup Take 20 mLs by mouth every 4 (four) hours as needed for cough.   Yes [provider]  ibuprofen  (ADVIL ) 200 MG tablet Take 1,200 mg by mouth daily as needed for headache, mild pain (pain score 1-3) or moderate pain (pain score 4-6).   Yes [provider]  VENTOLIN HFA 108 (90 Base) MCG/ACT inhaler Inhale 1-2 puffs into the lungs as needed for wheezing or shortness of breath. 01/07/24  Yes [provider]  senna (SENOKOT) 8.6 MG TABS tablet Take 2 tablets (17.2 mg total) by mouth daily as needed for mild constipation. Patient not taking: Reported on 03/25/2024 07/30/23   Agrawal, Kavita, MD  tirzepatide  (MOUNJARO ) 2.5 MG/0.5ML Pen Inject 2.5 mg into the skin once a week. Patient not taking: Reported on 07/30/2023 07/01/23   Antonette Angeline ORN, NP    Physical Exam: Vitals:   03/24/24 2030 03/24/24 2100 03/24/24 2330 03/25/24 0000  BP: 95/79 107/73 (!) 151/97 (!) 138/93  Pulse: 89 85 86  84  Resp:    16  Temp:    98.2 F (36.8 C)  TempSrc:    Oral  SpO2: 100% 100% 99% 99%  Weight:      Height:       Physical Exam Vitals and nursing note reviewed.  Constitutional:      General: She is not in acute distress. HENT:     Head: Normocephalic and atraumatic.     Comments: See pic below  Tender swelling with erythema above the left ear at the site of braid Cardiovascular:     Rate and Rhythm: Normal rate and regular rhythm.     Heart sounds: Normal heart sounds.  Pulmonary:     Effort: Pulmonary effort is normal.     Breath sounds: Normal breath sounds.   Abdominal:     Palpations: Abdomen is soft.     Tenderness: There is no abdominal tenderness.  Neurological:     Mental Status: Mental status is at baseline.     Labs on Admission: I have personally reviewed following labs and imaging studies  CBC: Recent Labs  Lab 03/24/24 1634  WBC 9.2  HGB 12.3  HCT 39.6  MCV 83.9  PLT 350   Basic Metabolic Panel: Recent Labs  Lab 03/24/24 1634  NA 135  K 4.0  CL 97*  CO2 27  GLUCOSE 381*  BUN 10  CREATININE 0.68  CALCIUM 8.6*   GFR: Estimated Creatinine Clearance: 148.6 mL/min (by C-G formula based on SCr of 0.68 mg/dL). Liver Function Tests: No results for input(s): AST, ALT, ALKPHOS, BILITOT, PROT, ALBUMIN in the last 168 hours. No results for input(s): LIPASE, AMYLASE in the last 168 hours. No results for input(s): AMMONIA in the last 168 hours. Coagulation Profile: No results for input(s): INR, PROTIME in the last 168 hours. Cardiac Enzymes: No results for input(s): CKTOTAL, CKMB, CKMBINDEX, TROPONINI in the last 168 hours. BNP (last 3 results) No results for input(s): PROBNP in the last 8760 hours. HbA1C: No results for input(s): HGBA1C in the last 72 hours. CBG: No results for input(s): GLUCAP in the last 168 hours. Lipid Profile: No results for input(s): CHOL, HDL, LDLCALC, TRIG, CHOLHDL, LDLDIRECT in the last 72 hours. Thyroid Function Tests: No results for input(s): TSH, T4TOTAL, FREET4, T3FREE, THYROIDAB in the last 72 hours. Anemia Panel: No results for input(s): VITAMINB12, FOLATE, FERRITIN, TIBC, IRON , RETICCTPCT in the last 72 hours. Urine analysis:    Component Value Date/Time   COLORURINE STRAW (A) 02/29/2020 2154   APPEARANCEUR CLEAR (A) 02/29/2020 2154   APPEARANCEUR Hazy 11/09/2013 2108   LABSPEC 1.032 (H) 02/29/2020 2154   LABSPEC 1.029 11/09/2013 2108   PHURINE 6.0 02/29/2020 2154   GLUCOSEU >=500 (A) 02/29/2020 2154    GLUCOSEU Negative 11/09/2013 2108   HGBUR NEGATIVE 02/29/2020 2154   BILIRUBINUR NEGATIVE 02/29/2020 2154   BILIRUBINUR Negative 11/09/2013 2108   KETONESUR NEGATIVE 02/29/2020 2154   PROTEINUR NEGATIVE 02/29/2020 2154   NITRITE NEGATIVE 02/29/2020 2154   LEUKOCYTESUR NEGATIVE 02/29/2020 2154   LEUKOCYTESUR Negative 11/09/2013 2108    Radiological Exams on Admission: CT Maxillofacial W Contrast Result Date: 03/24/2024 EXAM: CT Face with contrast 03/24/2024 10:21:26 PM TECHNIQUE: CT of the face was performed with the administration of intravenous contrast. Multiplanar reformatted images are provided for review. Automated exposure control, iterative reconstruction, and/or weight based adjustment of the mA/kV was utilized to reduce the radiation dose to as low as reasonably achievable. COMPARISON: CT face 08/20/2021 CLINICAL HISTORY: Left facial swelling,  eval abscess. FINDINGS: AERODIGESTIVE TRACT: No mass. No edema. SALIVARY GLANDS: No acute abnormality. LYMPH NODES: No suspicious cervical lymphadenopathy. SOFT TISSUES: Edema overlying the left maxilla and mandible. Small (4 x 4 mm) hypodensity along the left maxilla in this region (series 3 image 32) is suspicous for small abscess. Mildly prominent left upper neck lymph nodes. BRAIN, ORBITS AND SINUSES: Opacified right anterior ethmoid cells. Otherwise, common sinuses are clear. did BONES: No acute abnormality. No suspicious bone lesion. IMPRESSION: 1. Findings compatible with cellulitis overlying the left maxilla/mandible with suspected small 4 mm abscess along the maxilla. 2. Mildly prominent left upper neck lymph nodes, nonspecific but likely reactive given the above findings. Electronically signed by: Gilmore Molt MD 03/24/2024 10:43 PM EST RP Workstation: [id number]   Data Reviewed for HPI: Relevant notes from primary care and specialist visits, past discharge summaries as available in EHR, including Care Everywhere. Prior diagnostic testing  as pertinent to current admission diagnoses Updated medications and problem lists for reconciliation ED course, including vitals, labs, imaging, treatment and response to treatment Triage notes, nursing and pharmacy notes and ED provider's notes Notable results as noted above in HPI      Assessment and Plan: * Facial cellulitis with small abscesses Traction folliculitis scalp Unasyn Pain control Warm compresses  Uncontrolled type 2 diabetes mellitus with hyperglycemia, without long-term current use of insulin (HCC) Blood glucose 381 Sliding scale insulin coverage  Morbid obesity with BMI of 50.0-59.9, adult (HCC) Complicating factor for long-term prognosis  HTN (hypertension) Continue home meds    DVT prophylaxis: lovenox  Consults: none  Advance Care Planning: full code  Family Communication: none  Disposition Plan: Back to previous home environment  Severity of Illness: The appropriate patient status for this patient is OBSERVATION. Observation status is judged to be reasonable and necessary in order to provide the required intensity of service to ensure the patient's safety. The patient's presenting symptoms, physical exam findings, and initial radiographic and laboratory data in the context of their medical condition is felt to place them at decreased risk for further clinical deterioration. Furthermore, it is anticipated that the patient will be medically stable for discharge from the hospital within 2 midnights of admission.   Author: Delayne LULLA Solian, MD 03/25/2024 2:55 AM  For on call review [email or web address].

## 2024-03-25 NOTE — Progress Notes (Signed)
 Anticoagulation monitoring(Lovenox):  33 yo female ordered Lovenox 40 mg Q24h    Filed Weights   03/24/24 1633  Weight: (!) 147.4 kg (325 lb)   BMI 50.9    Lab Results  Component Value Date   CREATININE 0.68 03/24/2024   CREATININE 0.58 07/01/2023   CREATININE 0.55 04/02/2023   Estimated Creatinine Clearance: 148.6 mL/min (by C-G formula based on SCr of 0.68 mg/dL). Hemoglobin & Hematocrit     Component Value Date/Time   HGB 12.3 03/24/2024 1634   HGB 13.1 07/30/2023 0938   HGB 9.8 (L) 05/05/2014 1100   HCT 39.6 03/24/2024 1634   HCT 31.2 (L) 05/05/2014 1100     Per Protocol for Patient with estCrcl > 30 ml/min and BMI > 30, will transition to Lovenox 72.5 mg Q24h.

## 2024-03-25 NOTE — Assessment & Plan Note (Signed)
 Complicating factor for long-term prognosis

## 2024-03-25 NOTE — Assessment & Plan Note (Addendum)
 Traction folliculitis scalp Unasyn Pain control Warm compresses

## 2024-03-25 NOTE — Assessment & Plan Note (Signed)
-   Continue home meds

## 2024-03-25 NOTE — Plan of Care (Signed)
  Problem: Health Behavior/Discharge Planning: Goal: Ability to identify and utilize available resources and services will improve Outcome: Progressing Goal: Ability to manage health-related needs will improve Outcome: Progressing   Problem: Skin Integrity: Goal: Risk for impaired skin integrity will decrease Outcome: Progressing   Problem: Education: Goal: Knowledge of General Education information will improve Description: Including pain rating scale, medication(s)/side effects and non-pharmacologic comfort measures Outcome: Progressing

## 2024-03-26 DIAGNOSIS — L03211 Cellulitis of face: Secondary | ICD-10-CM | POA: Diagnosis not present

## 2024-03-26 LAB — GLUCOSE, CAPILLARY
Glucose-Capillary: 275 mg/dL — ABNORMAL HIGH (ref 70–99)
Glucose-Capillary: 330 mg/dL — ABNORMAL HIGH (ref 70–99)

## 2024-03-26 MED ORDER — BLOOD GLUCOSE MONITORING SUPPL DEVI: 1.0000 | 0 refills | Status: AC

## 2024-03-26 MED ORDER — LANCETS MISC: 1.0000 | 0 refills | Status: AC

## 2024-03-26 MED ORDER — LANCET DEVICE MISC: 1.0000 | 0 refills | Status: AC

## 2024-03-26 MED ORDER — PEN NEEDLES 31G X 5 MM MISC: 1.0000 | 0 refills | Status: AC

## 2024-03-26 MED ORDER — INSULIN GLARGINE-YFGN 100 UNIT/ML ~~LOC~~ SOPN: 25.0000 [IU] | PEN_INJECTOR | Freq: Every day | SUBCUTANEOUS | 2 refills | Status: DC

## 2024-03-26 MED ORDER — AMOXICILLIN-POT CLAVULANATE 875-125 MG PO TABS: 1.0000 | ORAL_TABLET | Freq: Two times a day (BID) | ORAL | 0 refills | Status: AC

## 2024-03-26 MED ORDER — BLOOD GLUCOSE TEST VI STRP: 1.0000 | ORAL_STRIP | 0 refills | Status: AC

## 2024-03-26 NOTE — Progress Notes (Signed)
 Discharge instructions given to patient, questions answered. IV removed without complications. Patient transporting home via car by her wife. Diabetic education given to patient, living well book and pen needles supplied to patient prior to discharge.

## 2024-03-26 NOTE — Discharge Summary (Signed)
 Physician Discharge Summary   Sandra Hays  female DOB: October 19, 1990  FMW:[id number]  PCP: Antonette Angeline ORN, NP  Admit date: 03/24/2024 Discharge date: 03/26/2024  Admitted From: home Disposition:  home CODE STATUS: Full code  Discharge Instructions     Diet Carb Modified   Complete by: As directed       Hospital Course:  For full details, please see H&P, progress notes, consult notes and ancillary notes.  Briefly,  Sandra Hays is a 33 y.o. 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  Patient had her hair done a week ago and noticed that the braids were very tight and loosened it out however a couple days prior she noted a tender swelling just above her ear on the left.   * Facial cellulitis with small abscesses Traction folliculitis scalp --CT maxillofacial showed cellulitis overlying the left maxilla/mandible with suspected small 4 mm abscess along the maxilla.  Pt was started on Unasyn with improvement.  On 11/9, pt asked to be discharged, and was discharged on 5 more days of Augmentin.  Uncontrolled type 2 diabetes mellitus with hyperglycemia --pt was only on glipizide  PTA.  A1c 12.5. Pt reported poor tolerance with both glipizide  and metformin.  Pt not taking Mounjaro  PTA. --home Glipizide  d/c'ed. --pt received education with diabetic coordinator, and was discharged on glargine 25u nightly. --outpatient f/u with PCP for further adjustment.   Morbid obesity with BMI of 50.0-59.9, adult (HCC) --Pt not taking Mounjaro  PTA. --encouraged weight loss to improve DM2   HTN (hypertension) Continue home meds as below   Discharge Diagnoses:  Principal Problem:   Facial cellulitis with small abscesses Active Problems:   Uncontrolled type 2 diabetes mellitus with hyperglycemia, without long-term  current use of insulin (HCC)   HTN (hypertension)   Morbid obesity with BMI of 50.0-59.9, adult [facility])     Discharge Instructions:  Allergies as of 03/26/2024   No Known Allergies      Medication List     STOP taking these medications    glipiZIDE  10 MG tablet Commonly known as: GLUCOTROL    senna 8.6 MG Tabs tablet Commonly known as: SENOKOT   tirzepatide  2.5 MG/0.5ML Pen Commonly known as: MOUNJARO        TAKE these medications    amLODipine -olmesartan  10-40 MG tablet Commonly known as: AZOR  Take 1 tablet by mouth daily.   amoxicillin -clavulanate 875-125 MG tablet Commonly known as: AUGMENTIN Take 1 tablet by mouth 2 (two) times daily for 5 days.   Blood Glucose Monitoring Suppl Devi 1 each by Does not apply route as directed. Dispense based on patient and insurance preference. Use up to four times daily as directed. (FOR ICD-10 E10.9, E11.9).   BLOOD GLUCOSE TEST STRIPS Strp 1 each by Does not apply route as directed. Dispense based on patient and insurance preference. Use up to four times daily as directed. (FOR ICD-10 E10.9, E11.9).   guaiFENesin-dextromethorphan 100-10 MG/5ML syrup Commonly known as: ROBITUSSIN DM Take 20 mLs by mouth every 4 (four) hours as needed for cough.   ibuprofen  200 MG tablet Commonly known as: ADVIL  Take 1,200 mg by mouth daily as needed for headache, mild pain (pain score 1-3) or moderate pain (pain score 4-6).   insulin glargine-yfgn 100 UNIT/ML Pen Commonly known as: SEMGLEE Inject 25 Units into the skin at bedtime. May substitute as needed per  insurance.   Lancet Device Misc 1 each by Does not apply route as directed. Dispense based on patient and insurance preference. Use up to four times daily as directed. (FOR ICD-10 E10.9, E11.9).   Lancets Misc 1 each by Does not apply route as directed. Dispense based on patient and insurance preference. Use up to four times daily as directed. (FOR ICD-10 E10.9, E11.9).   Pen  Needles 31G X 5 MM Misc 1 each by Does not apply route as directed. Dispense based on patient and insurance preference. Use up to four times daily as directed. (FOR ICD-10 E10.9, E11.9).   Ventolin HFA 108 (90 Base) MCG/ACT inhaler Generic drug: albuterol Inhale 1-2 puffs into the lungs as needed for wheezing or shortness of breath.         Follow-up Information     Antonette Angeline ORN, NP Follow up in 1 week(s).   Specialties: Internal Medicine, Emergency Medicine Contact information: [address] [place] [address] [phone]                 No Known Allergies   The results of significant diagnostics from this hospitalization (including imaging, microbiology, ancillary and laboratory) are listed below for reference.   Consultations:   Procedures/Studies: CT Maxillofacial W Contrast Result Date: 03/24/2024 EXAM: CT Face with contrast 03/24/2024 10:21:26 PM TECHNIQUE: CT of the face was performed with the administration of intravenous contrast. Multiplanar reformatted images are provided for review. Automated exposure control, iterative reconstruction, and/or weight based adjustment of the mA/kV was utilized to reduce the radiation dose to as low as reasonably achievable. COMPARISON: CT face 08/20/2021 CLINICAL HISTORY: Left facial swelling, eval abscess. FINDINGS: AERODIGESTIVE TRACT: No mass. No edema. SALIVARY GLANDS: No acute abnormality. LYMPH NODES: No suspicious cervical lymphadenopathy. SOFT TISSUES: Edema overlying the left maxilla and mandible. Small (4 x 4 mm) hypodensity along the left maxilla in this region (series 3 image 32) is suspicous for small abscess. Mildly prominent left upper neck lymph nodes. BRAIN, ORBITS AND SINUSES: Opacified right anterior ethmoid cells. Otherwise, common sinuses are clear. did BONES: No acute abnormality. No suspicious bone lesion. IMPRESSION: 1. Findings compatible with cellulitis overlying the left maxilla/mandible with suspected  small 4 mm abscess along the maxilla. 2. Mildly prominent left upper neck lymph nodes, nonspecific but likely reactive given the above findings. Electronically signed by: Gilmore Molt MD 03/24/2024 10:43 PM EST RP Workstation: [id number]      Labs: BNP (last 3 results) No results for input(s): BNP in the last 8760 hours. Basic Metabolic Panel: Recent Labs  Lab 03/24/24 1634  NA 135  K 4.0  CL 97*  CO2 27  GLUCOSE 381*  BUN 10  CREATININE 0.68  CALCIUM 8.6*   Liver Function Tests: No results for input(s): AST, ALT, ALKPHOS, BILITOT, PROT, ALBUMIN in the last 168 hours. No results for input(s): LIPASE, AMYLASE in the last 168 hours. No results for input(s): AMMONIA in the last 168 hours. CBC: Recent Labs  Lab 03/24/24 1634  WBC 9.2  HGB 12.3  HCT 39.6  MCV 83.9  PLT 350   Cardiac Enzymes: No results for input(s): CKTOTAL, CKMB, CKMBINDEX, TROPONINI in the last 168 hours. BNP: Invalid input(s): POCBNP CBG: Recent Labs  Lab 03/25/24 1153 03/25/24 1752 03/25/24 2130 03/26/24 0756 03/26/24 1151  GLUCAP 275* 347* 243* 275* 330*   D-Dimer No results for input(s): DDIMER in the last 72 hours. Hgb A1c Recent Labs    03/24/24 1642  HGBA1C 12.5*  Lipid Profile No results for input(s): CHOL, HDL, LDLCALC, TRIG, CHOLHDL, LDLDIRECT in the last 72 hours. Thyroid function studies No results for input(s): TSH, T4TOTAL, T3FREE, THYROIDAB in the last 72 hours.  Invalid input(s): FREET3 Anemia work up No results for input(s): VITAMINB12, FOLATE, FERRITIN, TIBC, IRON , RETICCTPCT in the last 72 hours. Urinalysis    Component Value Date/Time   COLORURINE STRAW (A) 02/29/2020 2154   APPEARANCEUR CLEAR (A) 02/29/2020 2154   APPEARANCEUR Hazy 11/09/2013 2108   LABSPEC 1.032 (H) 02/29/2020 2154   LABSPEC 1.029 11/09/2013 2108   PHURINE 6.0 02/29/2020 2154   GLUCOSEU >=500 (A) 02/29/2020 2154    GLUCOSEU Negative 11/09/2013 2108   HGBUR NEGATIVE 02/29/2020 2154   BILIRUBINUR NEGATIVE 02/29/2020 2154   BILIRUBINUR Negative 11/09/2013 2108   KETONESUR NEGATIVE 02/29/2020 2154   PROTEINUR NEGATIVE 02/29/2020 2154   NITRITE NEGATIVE 02/29/2020 2154   LEUKOCYTESUR NEGATIVE 02/29/2020 2154   LEUKOCYTESUR Negative 11/09/2013 2108   Sepsis Labs Recent Labs  Lab 03/24/24 1634  WBC 9.2   Microbiology No results found for this or any previous visit (from the past 240 hours).   Total time spend on discharging this patient, including the last patient exam, discussing the hospital stay, instructions for ongoing care as it relates to all pertinent caregivers, as well as preparing the medical discharge records, prescriptions, and/or referrals as applicable, is 45 minutes.    Ellouise Haber, MD  [facility] 03/26/2024, 3:53 PM

## 2024-03-26 NOTE — Inpatient Diabetes Management (Signed)
 Inpatient Diabetes Program Recommendations  AACE/ADA: New Consensus Statement on Inpatient Glycemic Control (2015)  Target Ranges:  Prepandial:   less than 140 mg/dL      Peak postprandial:   less than 180 mg/dL (1-2 hours)      Critically ill patients:  140 - 180 mg/dL   Lab Results  Component Value Date   GLUCAP 330 (H) 03/26/2024   HGBA1C 12.5 (H) 03/24/2024    Review of Glycemic Control  Diabetes history: DM2 Outpatient Diabetes medications: glipizide  10 BID, Mounjaro  2.5 mg weekly (not taking) Current orders for Inpatient glycemic control: Semglee 20 at bedtime, Novolog 0-20 TID with meals  HgbA1C - 12.5%  Inpatient Diabetes Program Recommendations:    For home:  Lantus/Semglee 25 units at bedtime  Humalog/Novolog 0-20 TID with meals  Spoke with pt on phone as Diabetes Coordinator working remotely. Ordered Living Well book and insulin pen starter kit. Discussed going home on insulin to better control her blood sugars. Stopped taking all DM meds, glipizide  caused constipation, metformin caused GI issues and pt never started Mounjaro .  Discussed A1C results (12.5% ) and explained that current A1C indicates an average glucose of 312 mg/dl over the past 2-3 months. Discussed glucose and A1C goals. Discussed importance of checking CBGs and maintaining good CBG control to prevent long-term and short-term complications. Explained how hyperglycemia leads to damage within blood vessels which lead to the common complications seen with uncontrolled diabetes. Stressed to the patient the importance of improving glycemic control to prevent further complications from uncontrolled diabetes. Discussed impact of nutrition, exercise, stress, sickness, and medications on diabetes control. Also reviewed hypoglycemia s/s and treatment. Pt states her wife has agreed to assist with insulin pen administration and glucose monitoring. Pt was asked to call PCP and obtain appt within next week or two to f/u  with starting insulin and take logbook for review. Discussed importance of leaving off sugary beverages and juice. Discussed importance of lifestyle modification and weight loss.  Secure text with MD and RN.   Thank you. Shona Brandy, RD, LDN, CDCES Inpatient Diabetes Coordinator [phone]

## 2024-03-27 ENCOUNTER — Telehealth: Payer: Self-pay

## 2024-03-27 NOTE — Transitions of Care (Post Inpatient/ED Visit) (Unsigned)
   03/27/2024  Name: Sandra Hays MRN: [id number] DOB: August 11, 1990  Today's TOC FU Call Status: Today's TOC FU Call Status:: Unsuccessful Call (1st Attempt) Unsuccessful Call (1st Attempt) Date: 03/27/24  Attempted to reach the patient regarding the most recent Inpatient/ED visit.  Follow Up Plan: Additional outreach attempts will be made to reach the patient to complete the Transitions of Care (Post Inpatient/ED visit) call.   Signature Julian Lemmings, LPN [facility] Nurse Health Advisor Direct Dial [phone]

## 2024-03-28 ENCOUNTER — Encounter: Payer: Self-pay | Admitting: Internal Medicine

## 2024-03-28 ENCOUNTER — Other Ambulatory Visit: Payer: Self-pay | Admitting: *Deleted

## 2024-03-28 ENCOUNTER — Inpatient Hospital Stay: Admitting: Internal Medicine

## 2024-03-28 DIAGNOSIS — R9389 Abnormal findings on diagnostic imaging of other specified body structures: Secondary | ICD-10-CM | POA: Diagnosis not present

## 2024-03-28 DIAGNOSIS — R519 Headache, unspecified: Secondary | ICD-10-CM | POA: Diagnosis not present

## 2024-03-28 DIAGNOSIS — D500 Iron deficiency anemia secondary to blood loss (chronic): Secondary | ICD-10-CM

## 2024-03-28 DIAGNOSIS — D649 Anemia, unspecified: Secondary | ICD-10-CM

## 2024-03-28 NOTE — Transitions of Care (Post Inpatient/ED Visit) (Signed)
   03/28/2024  Name: MARJORIE DEPREY MRN: [id number] DOB: 1990-09-29  Today's TOC FU Call Status: Today's TOC FU Call Status:: Unsuccessful Call (1st Attempt) Unsuccessful Call (1st Attempt) Date: 03/27/24  Attempted to reach the patient regarding the most recent Inpatient/ED visit.  Follow Up Plan: No further outreach attempts will be made at this time. We have been unable to contact the patient. Patient already seen in office Signature  Julian Lemmings, LPN [facility] Nurse Health Advisor Direct Dial [phone]

## 2024-03-28 NOTE — ED Provider Notes (Signed)
 [facility] Emergency Department Provider Note   ED Clinical Impression   Final diagnoses:  Facial cellulitis (Primary)     History   Chief Complaint Chief Complaint  Patient presents with  . Medical Problem Re-evaluation    HPI  History of Present Illness Brenlee Brannock is a 33 year old female with diabetes who presents with increased left-sided facial swelling and pain. She is accompanied by her wife.  Facial swelling and pain began after a tight hairstyle, initially thought to be the cause. Symptoms worsened, leading to a diagnosis of an abscess and affected lymph nodes at [facility], where she received IV antibiotics during a two-day admission. Post-discharge, pain and swelling persisted, particularly on the left side of her face, behind her ear, and down her neck, despite taking griseofulvin and Augmentin as prescribed.  She experiences no fever but feels intermittently hot. There is no difficulty with mouth opening, drooling, breathing, or swallowing, though she has a very dry throat and reduced appetite. Pain management includes ibuprofen  and Tylenol , though none was taken on the day of the visit.  She has diabetes and recently started insulin therapy. The swelling is tight and hot, with new tenderness behind the ear developing over the past few days.   Impression, Medical Decision Making, ED Course   Impression and MDM:  Medical Decision Making 33 year old female with a history of diabetes presented with worsening left-sided facial swelling, pain, and lymphadenopathy after recent hospitalization for facial cellulitis and abscess, previously treated with IV and oral antibiotics. Exam revealed moderate swelling of the left maxillary and mandibular region, mild mastoid tenderness, and a boggy lesion in the left frontal scalp, without trismus, drooling, or airway compromise. Recent and repeat CT imaging demonstrated left facial and scalp cellulitis with phlegmon and new  organized fluid collection, but no indication for surgical intervention. She remains afebrile, non-toxic, and hemodynamically stable, with mildly elevated inflammatory markers and no leukocytosis.  Differential diagnosis includes, but is not limited to: - Facial and Scalp Cellulitis with Abscess/Phlegmon: Worsening left facial and scalp swelling, pain, and lymphadenopathy with imaging confirming cellulitis, phlegmon, and new fluid collection, persistent despite prior antibiotics, and no surgical intervention indicated at this time. - Fungal Infection ([place]): Considered due to boggy scalp lesion and prior prescription of griseofulvin, but current findings and imaging are more consistent with bacterial cellulitis and abscess. - Mastoiditis: Clinically well-appearing, no significant mastoid tenderness and negative CT  Facial and Scalp Cellulitis with Abscess/Phlegmon - Add Bactrim for MRSA coverage for seven days - Continue Augmentin - Prescribe oxycodone for severe breakthrough pain - Advise use of Tylenol  and Motrin  at home - Order follow-up with primary care physician in 2-3 days for re-evaluation - Discussed cautions involving airway edema or swelling   MDM Elements I have reviewed recent and relevant previous record, including: ED note and discharge summary from 03/26/2024 Escalation of Care including OBS/Admission/Transfer was considered: However, patient was determined to be appropriate for outpatient management.  The case was discussed with the attending physician, who is in agreement with the above assessment and plan.   Orders Placed This Encounter  Procedures  . CT Maxillofacial W Contrast  . CT head WO contrast  . CBC w/ Differential  . Comprehensive metabolic panel  . Sedimentation rate, manual  . C-reactive protein  . hCG, Quantitative, Pregnancy      Physical Exam   VITAL SIGNS:    Vitals:   03/28/24 1753 03/28/24 2058 03/28/24 2316  BP: 175/113 164/105 141/104  Pulse: 80 84 82  Resp: 20 16 18   Temp: 36.7 C (98 F) 36.7 C (98.1 F)   SpO2: 98%  100%    Constitutional: Alert and oriented. No acute distress. Eyes: Conjunctivae are normal. HEENT: Oropharynx is well patent, no oropharyngeal erythema or tonsillar purulence.  Trismus or drooling.  The left maxillofacial region with edema, no warmth or erythema appreciated.  There is mild mastoid tenderness on the left and there is a lesion on left frontal scalp which is slightly boggy with small areas of purulence.  Limited neck range of motion secondary to pain, though no nuchal rigidity. Cardiovascular: Rate as above, regular rhythm. Normal and symmetric distal pulses. No lower extremity edema Respiratory: Normal respiratory effort. Clear breath sounds bilaterally. No wheezes, rhonchi, rales.  Gastrointestinal: Soft, non-distended, non-tender.  Genitourinary: Deferred. Musculoskeletal: Non-tender with normal range of motion in all extremities. Neurologic: Normal speech and language. No facial asymmetry. No gross focal neurologic deficits are appreciated. Patient moves all extremities equally. Skin: Skin warm, dry and intact. No rash or erythema noted. Psychiatric: Mood, affect, and speech grossly normal.     Other History   Past Medical History[1]  Past Surgical History[2]  No current facility-administered medications for this encounter.  Current Outpatient Medications:  .  albuterol HFA 90 mcg/actuation inhaler, Inhale 4 puffs every four (4) hours as needed for wheezing., Disp: 8 g, Rfl: 0 .  griseofulvin (GRIS-PEG) 250 MG tablet, Take 2 tablets (500 mg total) by mouth daily for 14 days., Disp: 28 tablet, Rfl: 0 .  metFORMIN (GLUCOPHAGE) 500 MG tablet, Take 1 tablet (500 mg total) by mouth in the morning and 1 tablet (500 mg total) in the evening. Take with meals., Disp: 60 tablet, Rfl: 0 .  oxyCODONE (ROXICODONE) 5 MG immediate release tablet, Take 1 tablet (5 mg total) by mouth every six  (6) hours as needed for pain for up to 5 days., Disp: 10 tablet, Rfl: 0 .  sulfamethoxazole-trimethoprim (BACTRIM DS) 800-160 mg per tablet, Take 2 tablets (320 mg of trimethoprim total) by mouth two (2) times a day for 7 days., Disp: 28 tablet, Rfl: 0  Allergies Patient has no known allergies.  Family History Family History[3]  Social History Short Social History[4]   Radiology   CT Maxillofacial W Contrast  Final Result    Findings consistent with left frontal and parietal scalp cellulitis with associated phlegmonous changes. No organized fluid collection/mature abscess or underlying calvarial involvement.    Several enlarged left cervical lymph nodes, which may be reactive.    No acute intracranial abnormalities.      CT head WO contrast  Final Result    Findings consistent with left frontal and parietal scalp cellulitis with associated phlegmonous changes. No organized fluid collection/mature abscess or underlying calvarial involvement.    Several enlarged left cervical lymph nodes, which may be reactive.    No acute intracranial abnormalities.        Pertinent labs & imaging results that were available during my care of the patient were independently interpreted by me and considered in my medical decision making (see chart for details).  Portions of this record have been created using Scientist, clinical (histocompatibility and immunogenetics). Dictation errors have been sought, but may not have been identified and corrected.        [1] Past Medical History: Diagnosis Date  . Asthma (HHS-HCC)   [2] No past surgical history on file. [3] No family history on file. [4] Social History Tobacco Use  .  Smoking status: Every Day    Types: Cigarettes  . Smokeless tobacco: Never  Substance Use Topics  . Alcohol use: Not Currently  . Drug use: Never   Puscheck, Josefa LABOR, MD Resident 03/28/24 [phone]

## 2024-03-28 NOTE — Progress Notes (Deleted)
 Subjective:    Patient ID: Sandra Hays, female    DOB: January 23, 1991, 33 y.o.   MRN: [id number]  HPI     Review of Systems   Past Medical History:  Diagnosis Date   Diabetes mellitus without complication (HCC)     Current Outpatient Medications  Medication Sig Dispense Refill   amLODipine -olmesartan  (AZOR ) 10-40 MG tablet Take 1 tablet by mouth daily. 90 tablet 0   amoxicillin -clavulanate (AUGMENTIN) 875-125 MG tablet Take 1 tablet by mouth 2 (two) times daily for 5 days. 10 tablet 0   Blood Glucose Monitoring Suppl DEVI 1 each by Does not apply route as directed. Dispense based on patient and insurance preference. Use up to four times daily as directed. (FOR ICD-10 E10.9, E11.9). 1 each 0   Glucose Blood (BLOOD GLUCOSE TEST STRIPS) STRP 1 each by Does not apply route as directed. Dispense based on patient and insurance preference. Use up to four times daily as directed. (FOR ICD-10 E10.9, E11.9). 100 each 0   guaiFENesin-dextromethorphan (ROBITUSSIN DM) 100-10 MG/5ML syrup Take 20 mLs by mouth every 4 (four) hours as needed for cough.     ibuprofen  (ADVIL ) 200 MG tablet Take 1,200 mg by mouth daily as needed for headache, mild pain (pain score 1-3) or moderate pain (pain score 4-6).     insulin glargine-yfgn (SEMGLEE) 100 UNIT/ML Pen Inject 25 Units into the skin at bedtime. May substitute as needed per insurance. 7.5 mL 2   Insulin Pen Needle (PEN NEEDLES) 31G X 5 MM MISC 1 each by Does not apply route as directed. Dispense based on patient and insurance preference. Use up to four times daily as directed. (FOR ICD-10 E10.9, E11.9). 100 each 0   Lancet Device MISC 1 each by Does not apply route as directed. Dispense based on patient and insurance preference. Use up to four times daily as directed. (FOR ICD-10 E10.9, E11.9). 1 each 0   Lancets MISC 1 each by Does not apply route as directed. Dispense based on patient and insurance preference. Use up to four times daily as directed. (FOR  ICD-10 E10.9, E11.9). 100 each 0   VENTOLIN HFA 108 (90 Base) MCG/ACT inhaler Inhale 1-2 puffs into the lungs as needed for wheezing or shortness of breath.     No current facility-administered medications for this visit.    No Known Allergies  Family History  Problem Relation Age of Onset   Diabetes Mother    Parkinson's disease Mother    Irregular heart beat Mother    Healthy Father    Healthy Sister    Diabetes Paternal Grandmother    Breast cancer Other    Colon cancer Neg Hx     Social History   Socioeconomic History   Marital status: Married    Spouse name: Not on file   Number of children: Not on file   Years of education: Not on file   Highest education level: Not on file  Occupational History   Not on file  Tobacco Use   Smoking status: Every Day    Types: Cigarettes   Smokeless tobacco: Never  Vaping Use   Vaping status: Never Used  Substance and Sexual Activity   Alcohol use: Not Currently   Drug use: Never   Sexual activity: Not on file  Other Topics Concern   Not on file  Social History Narrative   Not on file   Social Drivers of Health   Financial Resource Strain: Not on file  Food Insecurity: No Food Insecurity (03/25/2024)   Hunger Vital Sign    Worried About Running Out of Food in the Last Year: Never true    Ran Out of Food in the Last Year: Never true  Transportation Needs: No Transportation Needs (03/25/2024)   PRAPARE - Administrator, Civil Service (Medical): No    Lack of Transportation (Non-Medical): No  Physical Activity: Not on file  Stress: Not on file  Social Connections: Not on file  Intimate Partner Violence: Not At Risk (03/25/2024)   Humiliation, Afraid, Rape, and Kick questionnaire    Fear of Current or Ex-Partner: No    Emotionally Abused: No    Physically Abused: No    Sexually Abused: No     Constitutional: Denies fever, malaise, fatigue, headache or abrupt weight changes.  HEENT: Denies eye pain, eye  redness, ear pain, ringing in the ears, wax buildup, runny nose, nasal congestion, bloody nose, or sore throat. Respiratory: Denies difficulty breathing, shortness of breath, cough or sputum production.   Cardiovascular: Denies chest pain, chest tightness, palpitations or swelling in the hands or feet.  Gastrointestinal: Denies abdominal pain, bloating, constipation, diarrhea or blood in the stool.  GU: Denies urgency, frequency, pain with urination, burning sensation, blood in urine, odor or discharge. Musculoskeletal: Denies decrease in range of motion, difficulty with gait, muscle pain or joint pain and swelling.  Skin: Denies redness, rashes, lesions or ulcercations.  Neurological: Patient reports insomnia.  Denies dizziness, difficulty with memory, difficulty with speech or problems with balance and coordination.  Psych: Denies anxiety, depression, SI/HI.  No other specific complaints in a complete review of systems (except as listed in HPI above).      Objective:   Physical Exam LMP 03/01/2024   Wt Readings from Last 3 Encounters:  03/24/24 (!) 325 lb (147.4 kg)  10/09/23 (!) 346 lb (156.9 kg)  09/29/23 225 lb (102.1 kg)    General: Appears her stated age, obese in NAD. Skin: Warm, dry and intact. No ulcerations noted. HEENT: Head: normal shape and size; Eyes: sclera white, no icterus, conjunctiva pink, PERRLA and EOMs intact;  Neck:  Neck supple, trachea midline. No masses, lumps or thyromegaly present.  Cardiovascular: Normal rate and rhythm. S1,S2 noted.  No murmur, rubs or gallops noted. No JVD or BLE edema.  Pulmonary/Chest: Normal effort and positive vesicular breath sounds. No respiratory distress. No wheezes, rales or ronchi noted.  Abdomen: Soft and nontender. Normal bowel sounds.  Musculoskeletal: Strength 5/5 BUE/BLE.  No difficulty with gait.  Neurological: Alert and oriented. Cranial nerves II-XII grossly intact. Coordination normal.  Psychiatric: Mood and affect  normal. Behavior is normal. Judgment and thought content normal.    BMET    Component Value Date/Time   NA 135 03/24/2024 1634   NA 140 05/05/2014 1100   K 4.0 03/24/2024 1634   K 4.0 05/05/2014 1100   CL 97 (L) 03/24/2024 1634   CL 106 05/05/2014 1100   CO2 27 03/24/2024 1634   CO2 25 05/05/2014 1100   GLUCOSE 381 (H) 03/24/2024 1634   GLUCOSE 117 (H) 05/05/2014 1100   BUN 10 03/24/2024 1634   BUN 8 05/05/2014 1100   CREATININE 0.68 03/24/2024 1634   CREATININE 0.58 07/01/2023 0924   CALCIUM 8.6 (L) 03/24/2024 1634   CALCIUM 8.4 (L) 05/05/2014 1100   GFRNONAA >60 03/24/2024 1634   GFRNONAA >60 05/05/2014 1100   GFRNONAA >60 11/09/2013 1854   GFRAA >60 05/05/2014 1100   GFRAA >  60 11/09/2013 1854    Lipid Panel     Component Value Date/Time   CHOL 150 07/01/2023 0924   TRIG 71 07/01/2023 0924   HDL 47 (L) 07/01/2023 0924   CHOLHDL 3.2 07/01/2023 0924   LDLCALC 87 07/01/2023 0924    CBC    Component Value Date/Time   WBC 9.2 03/24/2024 1634   RBC 4.72 03/24/2024 1634   HGB 12.3 03/24/2024 1634   HGB 13.1 07/30/2023 0938   HGB 9.8 (L) 05/05/2014 1100   HCT 39.6 03/24/2024 1634   HCT 31.2 (L) 05/05/2014 1100   PLT 350 03/24/2024 1634   PLT 352 07/30/2023 0938   PLT 455 (H) 05/05/2014 1100   MCV 83.9 03/24/2024 1634   MCV 69 (L) 05/05/2014 1100   MCH 26.1 03/24/2024 1634   MCHC 31.1 03/24/2024 1634   RDW 13.9 03/24/2024 1634   RDW 20.4 (H) 05/05/2014 1100   LYMPHSABS 2.1 07/30/2023 0938   LYMPHSABS 1.9 11/09/2013 1854   MONOABS 0.5 07/30/2023 0938   MONOABS 0.5 11/09/2013 1854   EOSABS 0.6 (H) 07/30/2023 0938   EOSABS 0.6 11/09/2013 1854   BASOSABS 0.1 07/30/2023 0938   BASOSABS 0.1 11/09/2013 1854    Hgb A1C Lab Results  Component Value Date   HGBA1C 12.5 (H) 03/24/2024            Assessment & Plan:     RTC in 3 months for follow-up of chronic conditions Angeline Laura, NP

## 2024-03-29 ENCOUNTER — Encounter: Payer: Self-pay | Admitting: Internal Medicine

## 2024-03-29 ENCOUNTER — Ambulatory Visit

## 2024-03-29 ENCOUNTER — Inpatient Hospital Stay: Admitting: Internal Medicine

## 2024-03-29 ENCOUNTER — Ambulatory Visit: Admitting: Internal Medicine

## 2024-03-29 ENCOUNTER — Inpatient Hospital Stay: Attending: Internal Medicine

## 2024-03-29 VITALS — BP 170/100 | Temp 97.8°F | Ht 67.0 in | Wt 378.5 lb

## 2024-03-29 VITALS — BP 132/101 | HR 88

## 2024-03-29 VITALS — BP 145/99 | HR 90 | Temp 98.7°F | Resp 16 | Ht 67.0 in | Wt 376.9 lb

## 2024-03-29 DIAGNOSIS — L03211 Cellulitis of face: Secondary | ICD-10-CM | POA: Diagnosis not present

## 2024-03-29 DIAGNOSIS — E1165 Type 2 diabetes mellitus with hyperglycemia: Secondary | ICD-10-CM

## 2024-03-29 DIAGNOSIS — F17210 Nicotine dependence, cigarettes, uncomplicated: Secondary | ICD-10-CM | POA: Diagnosis not present

## 2024-03-29 DIAGNOSIS — L02811 Cutaneous abscess of head [any part, except face]: Secondary | ICD-10-CM | POA: Diagnosis not present

## 2024-03-29 DIAGNOSIS — I10 Essential (primary) hypertension: Secondary | ICD-10-CM | POA: Insufficient documentation

## 2024-03-29 DIAGNOSIS — Z803 Family history of malignant neoplasm of breast: Secondary | ICD-10-CM | POA: Insufficient documentation

## 2024-03-29 DIAGNOSIS — K5900 Constipation, unspecified: Secondary | ICD-10-CM | POA: Diagnosis not present

## 2024-03-29 DIAGNOSIS — D649 Anemia, unspecified: Secondary | ICD-10-CM

## 2024-03-29 DIAGNOSIS — E119 Type 2 diabetes mellitus without complications: Secondary | ICD-10-CM | POA: Insufficient documentation

## 2024-03-29 DIAGNOSIS — D509 Iron deficiency anemia, unspecified: Secondary | ICD-10-CM | POA: Diagnosis present

## 2024-03-29 DIAGNOSIS — D500 Iron deficiency anemia secondary to blood loss (chronic): Secondary | ICD-10-CM

## 2024-03-29 LAB — CBC WITH DIFFERENTIAL (CANCER CENTER ONLY)
Abs Immature Granulocytes: 0.02 K/uL (ref 0.00–0.07)
Basophils Absolute: 0 K/uL (ref 0.0–0.1)
Basophils Relative: 1 %
Eosinophils Absolute: 0.7 K/uL — ABNORMAL HIGH (ref 0.0–0.5)
Eosinophils Relative: 9 %
HCT: 37.7 % (ref 36.0–46.0)
Hemoglobin: 12.3 g/dL (ref 12.0–15.0)
Immature Granulocytes: 0 %
Lymphocytes Relative: 29 %
Lymphs Abs: 2.1 K/uL (ref 0.7–4.0)
MCH: 26.8 pg (ref 26.0–34.0)
MCHC: 32.6 g/dL (ref 30.0–36.0)
MCV: 82.1 fL (ref 80.0–100.0)
Monocytes Absolute: 0.5 K/uL (ref 0.1–1.0)
Monocytes Relative: 7 %
Neutro Abs: 3.9 K/uL (ref 1.7–7.7)
Neutrophils Relative %: 54 %
Platelet Count: 336 K/uL (ref 150–400)
RBC: 4.59 MIL/uL (ref 3.87–5.11)
RDW: 13.9 % (ref 11.5–15.5)
WBC Count: 7.2 K/uL (ref 4.0–10.5)
nRBC: 0 % (ref 0.0–0.2)

## 2024-03-29 LAB — IRON AND TIBC
Iron: 37 ug/dL (ref 28–170)
Saturation Ratios: 10 % — ABNORMAL LOW (ref 10.4–31.8)
TIBC: 393 ug/dL (ref 250–450)
UIBC: 356 ug/dL

## 2024-03-29 LAB — CMP (CANCER CENTER ONLY)
ALT: 18 U/L (ref 0–44)
AST: 19 U/L (ref 15–41)
Albumin: 3.2 g/dL — ABNORMAL LOW (ref 3.5–5.0)
Alkaline Phosphatase: 73 U/L (ref 38–126)
Anion gap: 10 (ref 5–15)
BUN: 7 mg/dL (ref 6–20)
CO2: 26 mmol/L (ref 22–32)
Calcium: 8.4 mg/dL — ABNORMAL LOW (ref 8.9–10.3)
Chloride: 95 mmol/L — ABNORMAL LOW (ref 98–111)
Creatinine: 0.65 mg/dL (ref 0.44–1.00)
GFR, Estimated: >60 mL/min (ref >60)
Glucose, Bld: 357 mg/dL — ABNORMAL HIGH (ref 70–99)
Potassium: 4 mmol/L (ref 3.5–5.1)
Sodium: 131 mmol/L — ABNORMAL LOW (ref 135–145)
Total Bilirubin: <0.2 mg/dL (ref 0.0–1.2)
Total Protein: 6.8 g/dL (ref 6.5–8.1)

## 2024-03-29 LAB — FERRITIN: Ferritin: 23 ng/mL (ref 11–307)

## 2024-03-29 MED ORDER — IRON SUCROSE 20 MG/ML IV SOLN
200.0000 mg | Freq: Once | INTRAVENOUS | Status: AC
  Administered 2024-03-29: 200 mg via INTRAVENOUS

## 2024-03-29 MED ORDER — SODIUM CHLORIDE 0.9% FLUSH
10.0000 mL | Freq: Once | INTRAVENOUS | Status: AC | PRN
  Administered 2024-03-29: 10 mL
  Filled 2024-03-29: qty 10

## 2024-03-29 NOTE — Assessment & Plan Note (Addendum)
 Iron  deficiency anemia: Chronic since 2014.  Likely secondary to heavy menstrual cycles. Has previously seen OB/GYN.  -Intolerant to oral iron -constipation, upset stomach.   # Hb today is 12.3- ok with venofer  today.    # Constipation: -New.  Took milk of magnesium with mild improvement. -Advised to do MiraLAX once to twice a day as needed.  Sent prescription for Senokot 1-2 tabs daily as needed.    # Hypertension-on amlodipine  and olmesartan - recommend close monitoring of Blood pressures.    # Diabetes-poorly controolled-on insuin- on glipizide   # DISPOSITION: # venofer  today- ok with BP # follow up in 6 months- MD; labs- cbc/cmp; iron  studies;ferritin- possible venofer - Dr.B

## 2024-03-29 NOTE — Patient Instructions (Signed)
 Skin Abscess    A skin abscess is an infected spot of skin. It can have pus in it. An abscess can happen in any part of your body.  Some abscesses break open (rupture) on their own. Most keep getting worse unless they are treated. If your abscess is not treated, the infection can spread deeper into your body and blood. This can make you feel sick.  What are the causes?  Germs that enter your skin. This may happen if you have:  A cut or scrape.  A wound from a needle or an insect bite.  Blocked oil or sweat glands.  A problem with the spot where your hair goes into your skin.  A fluid-filled sac called a cyst under your skin.  What increases the risk?  Having problems with how your blood moves through your body.  Having a weak body defense system (immune system).  Having diabetes.  Having dry and irritated skin.  Needing to get shots often.  Putting drugs into your body with a needle.  Having a splinter or something else in your skin.  Smoking.  What are the signs or symptoms?  A firm bump under your skin that hurts.  A bump with pus at the top.  Redness and swelling.  Warm or tender spots.  A sore on the skin.  How is this treated?  You may need to:  Put a heat pack or a warm, wet washcloth on the spot.  Have the pus drained.  Take antibiotics.  Follow these instructions at home:  Medicines  Take over-the-counter and prescription medicines only as told by your doctor.  If you were prescribed antibiotics, take them as told by your doctor. Do not stop taking them even if you start to feel better.  Abscess care    If you have an abscess that has not drained, put heat on it. Use the heat source that your doctor recommends, such as a moist heat pack or a heating pad.  Place a towel between your skin and the heat source.  Leave the heat on for 20-30 minutes.  If your skin turns bright red, take off the heat right away to prevent burns. The risk of burns is higher if you cannot feel pain, heat, or cold.  Follow  instructions from your doctor about how to take care of your abscess. Make sure you:  Cover the abscess with a bandage.  Wash your hands with soap and water for at least 20 seconds before and after you change your bandage. If you cannot use soap and water, use hand sanitizer.  Change your bandage as told by your doctor.  Check your abscess every day for signs that the infection is getting worse. Check for:  More redness, swelling, or pain.  More fluid or blood.  Warmth.  More pus or a worse smell.  General instructions  To keep the infection from spreading:  Do not share personal items or towels.  Do not go in a hot tub with others.  Avoid making skin contact with others.  Be careful when you get rid of used bandages or any pus from the abscess.  Do not smoke or use any products that contain nicotine or tobacco. If you need help quitting, ask your doctor.  Contact a doctor if:  You see red streaks on your skin near the abscess.  You have any signs of worse infection.  You vomit every time you eat or drink.  You have  a fever, chills, or muscle aches.  The cyst or abscess comes back.  Get help right away if:  You have very bad pain.  You make less pee (urine) than normal.  This information is not intended to replace advice given to you by your health care provider. Make sure you discuss any questions you have with your health care provider.  Document Revised: 12/17/2021 Document Reviewed: 12/17/2021  Elsevier Patient Education  2024 [employer].

## 2024-03-29 NOTE — Progress Notes (Signed)
 Subjective:    Patient ID: Sandra Hays, female    DOB: May 22, 1990, 33 y.o.   MRN: [id number]  HPI   Discussed the use of AI scribe software for clinical note transcription with the patient, who gave verbal consent to proceed.  Sandra Hays is a 33 year old female with diabetes who presents for TCM hospital follow-up for cyst of the scalp with facial cellulitis.  She initially presented to [facility] on November 7th with swelling and redness on the side of her neck, particularly in front and behind the left ear.  She had noticed a small bump above her ear that was painful.  A CT maxillofacial with contrast revealed cellulitis overlying the left maxilla area with a suspected 4 mm abscess and prominent left upper neck lymph nodes. She was treated with IV Unasyn, which improved her symptoms, and was discharged on November 9th with a prescription for Augmentin for five days.  On the same day of discharge, she went to [facility] ER due to persistent pain and facial heat. At [facility], she was treated for a possible fungal infection with griseofulvin and discharged the same day.  She returned to [facility] ER yesterday due to worsening facial swelling, particularly under the eye.  Repeat CT maxillofacial did not show any abscess but did show mild swelling in the same area of concern.  She was given morphine for pain and prescribed Bactrim in addition to Augmentin. However, she has not yet started Bactrim and is currently managing pain with over-the-counter ibuprofen  and Tylenol . She has a prescription for oxycodone that has not been filled yet.  Her blood pressure was noted to be elevated at 162/98, and she has not been taking her prescribed amlodipine  olmesartan  10-40 mg for the past couple of months as she cannot find it.  Her diabetes management includes starting insulin therapy with Lantus at 25 units before bed. Her A1c was recorded at 12.5 during her hospital visit, indicating poor glycemic control.      Review of Systems   Past Medical History:  Diagnosis Date   Diabetes mellitus without complication (HCC)     Current Outpatient Medications  Medication Sig Dispense Refill   amLODipine -olmesartan  (AZOR ) 10-40 MG tablet Take 1 tablet by mouth daily. 90 tablet 0   amoxicillin -clavulanate (AUGMENTIN) 875-125 MG tablet Take 1 tablet by mouth 2 (two) times daily for 5 days. 10 tablet 0   Blood Glucose Monitoring Suppl DEVI 1 each by Does not apply route as directed. Dispense based on patient and insurance preference. Use up to four times daily as directed. (FOR ICD-10 E10.9, E11.9). 1 each 0   Glucose Blood (BLOOD GLUCOSE TEST STRIPS) STRP 1 each by Does not apply route as directed. Dispense based on patient and insurance preference. Use up to four times daily as directed. (FOR ICD-10 E10.9, E11.9). 100 each 0   guaiFENesin-dextromethorphan (ROBITUSSIN DM) 100-10 MG/5ML syrup Take 20 mLs by mouth every 4 (four) hours as needed for cough.     ibuprofen  (ADVIL ) 200 MG tablet Take 1,200 mg by mouth daily as needed for headache, mild pain (pain score 1-3) or moderate pain (pain score 4-6).     insulin glargine-yfgn (SEMGLEE) 100 UNIT/ML Pen Inject 25 Units into the skin at bedtime. May substitute as needed per insurance. 7.5 mL 2   Insulin Pen Needle (PEN NEEDLES) 31G X 5 MM MISC 1 each by Does not apply route as directed. Dispense based on patient and insurance preference. Use up  to four times daily as directed. (FOR ICD-10 E10.9, E11.9). 100 each 0   Lancet Device MISC 1 each by Does not apply route as directed. Dispense based on patient and insurance preference. Use up to four times daily as directed. (FOR ICD-10 E10.9, E11.9). 1 each 0   Lancets MISC 1 each by Does not apply route as directed. Dispense based on patient and insurance preference. Use up to four times daily as directed. (FOR ICD-10 E10.9, E11.9). 100 each 0   VENTOLIN HFA 108 (90 Base) MCG/ACT inhaler Inhale 1-2 puffs into the  lungs as needed for wheezing or shortness of breath.     No current facility-administered medications for this visit.    No Known Allergies  Family History  Problem Relation Age of Onset   Diabetes Mother    Parkinson's disease Mother    Irregular heart beat Mother    Healthy Father    Healthy Sister    Diabetes Paternal Grandmother    Breast cancer Other    Colon cancer Neg Hx     Social History   Socioeconomic History   Marital status: Married    Spouse name: Not on file   Number of children: Not on file   Years of education: Not on file   Highest education level: Not on file  Occupational History   Not on file  Tobacco Use   Smoking status: Every Day    Types: Cigarettes   Smokeless tobacco: Never  Vaping Use   Vaping status: Never Used  Substance and Sexual Activity   Alcohol use: Not Currently   Drug use: Never   Sexual activity: Not on file  Other Topics Concern   Not on file  Social History Narrative   Not on file   Social Drivers of Health   Financial Resource Strain: Not on file  Food Insecurity: No Food Insecurity (03/25/2024)   Hunger Vital Sign    Worried About Running Out of Food in the Last Year: Never true    Ran Out of Food in the Last Year: Never true  Transportation Needs: No Transportation Needs (03/25/2024)   PRAPARE - Administrator, Civil Service (Medical): No    Lack of Transportation (Non-Medical): No  Physical Activity: Not on file  Stress: Not on file  Social Connections: Not on file  Intimate Partner Violence: Not At Risk (03/25/2024)   Humiliation, Afraid, Rape, and Kick questionnaire    Fear of Current or Ex-Partner: No    Emotionally Abused: No    Physically Abused: No    Sexually Abused: No     Constitutional: Denies fever, malaise, fatigue, headache or abrupt weight changes.  HEENT: Patient reports facial swelling, swelling around the left eye.  Denies eye pain, eye redness, ear pain, ringing in the ears, wax  buildup, runny nose, nasal congestion, bloody nose, or sore throat. Respiratory: Denies difficulty breathing, shortness of breath, cough or sputum production.   Cardiovascular: Denies chest pain, chest tightness, palpitations or swelling in the hands or feet.  Musculoskeletal: Denies decrease in range of motion, difficulty with gait, muscle pain or joint pain and swelling.  Skin: Patient reports abscess to left side of scalp.  Denies ulcercations.  Neurological: Patient reports insomnia.  Denies dizziness, difficulty with memory, difficulty with speech or problems with balance and coordination.    No other specific complaints in a complete review of systems (except as listed in HPI above).      Objective:  BP ROLLEN)  170/100   Temp 97.8 F (36.6 C)   Ht 5' 7 (1.702 m)   Wt (!) 378 lb 8 oz (171.7 kg)   LMP 03/01/2024   BMI 59.28 kg/m    Wt Readings from Last 3 Encounters:  03/24/24 (!) 325 lb (147.4 kg)  10/09/23 (!) 346 lb (156.9 kg)  09/29/23 225 lb (102.1 kg)    General: Appears her stated age, obese in NAD. Skin: Warm, dry and intact.  2.5 cm abscess noted in the left temporal region.  No ulcerations noted.   HEENT: Head: normal shape and size; Eyes: sclera white, no icterus, conjunctiva pink, PERRLA and EOMs intact, left periorbital edema noted;  Neck: Left postauricular adenopathy noted. Cardiovascular: Normal rate and rhythm. S1,S2 noted.  No murmur, rubs or gallops noted. No JVD or BLE edema.  Pulmonary/Chest: Normal effort and positive vesicular breath sounds. No respiratory distress. No wheezes, rales or ronchi noted.  Musculoskeletal:  No difficulty with gait.  Neurological: Alert and oriented.    BMET    Component Value Date/Time   NA 135 03/24/2024 1634   NA 140 05/05/2014 1100   K 4.0 03/24/2024 1634   K 4.0 05/05/2014 1100   CL 97 (L) 03/24/2024 1634   CL 106 05/05/2014 1100   CO2 27 03/24/2024 1634   CO2 25 05/05/2014 1100   GLUCOSE 381 (H) 03/24/2024  1634   GLUCOSE 117 (H) 05/05/2014 1100   BUN 10 03/24/2024 1634   BUN 8 05/05/2014 1100   CREATININE 0.68 03/24/2024 1634   CREATININE 0.58 07/01/2023 0924   CALCIUM 8.6 (L) 03/24/2024 1634   CALCIUM 8.4 (L) 05/05/2014 1100   GFRNONAA >60 03/24/2024 1634   GFRNONAA >60 05/05/2014 1100   GFRNONAA >60 11/09/2013 1854   GFRAA >60 05/05/2014 1100   GFRAA >60 11/09/2013 1854    Lipid Panel     Component Value Date/Time   CHOL 150 07/01/2023 0924   TRIG 71 07/01/2023 0924   HDL 47 (L) 07/01/2023 0924   CHOLHDL 3.2 07/01/2023 0924   LDLCALC 87 07/01/2023 0924    CBC    Component Value Date/Time   WBC 9.2 03/24/2024 1634   RBC 4.72 03/24/2024 1634   HGB 12.3 03/24/2024 1634   HGB 13.1 07/30/2023 0938   HGB 9.8 (L) 05/05/2014 1100   HCT 39.6 03/24/2024 1634   HCT 31.2 (L) 05/05/2014 1100   PLT 350 03/24/2024 1634   PLT 352 07/30/2023 0938   PLT 455 (H) 05/05/2014 1100   MCV 83.9 03/24/2024 1634   MCV 69 (L) 05/05/2014 1100   MCH 26.1 03/24/2024 1634   MCHC 31.1 03/24/2024 1634   RDW 13.9 03/24/2024 1634   RDW 20.4 (H) 05/05/2014 1100   LYMPHSABS 2.1 07/30/2023 0938   LYMPHSABS 1.9 11/09/2013 1854   MONOABS 0.5 07/30/2023 0938   MONOABS 0.5 11/09/2013 1854   EOSABS 0.6 (H) 07/30/2023 0938   EOSABS 0.6 11/09/2013 1854   BASOSABS 0.1 07/30/2023 0938   BASOSABS 0.1 11/09/2013 1854    Hgb A1C Lab Results  Component Value Date   HGBA1C 12.5 (H) 03/24/2024            Assessment & Plan:  Assessment and Plan    TCM hospital follow-up for cellulitis and abscess of left maxillofacial/neck region Hospital/ER notes, labs and imaging reviewed.  Recurrent cellulitis and abscess with periorbital swelling and pain. Performed I&D of the abscess.  See procedure note - Sent wound culture for organism identification. - Instructed to  pick up bactrim and oxycodone prescriptions. -Continue Augmentin as previously prescribed - Will follow up with culture results to adjust  antibiotic therapy if necessary.  Type 2 diabetes mellitus, poorly controlled Poorly controlled with A1c of 12.5. On Lantus 25 units before bed. - Continue Lantus 25 units before bed. - Monitor fasting blood sugars and bring log to next appointment. -Encouraged low-carb diet and exercise for weight loss - Scheduled follow-up in two weeks to assess diabetes management.  Hypertension, not on medication Hypertension with BP 162/98. Not taking amlodipine  olmesartan  due to running out. - Instructed to pick up and restart amlodipine  olmesartan  10/40 mg. -Reinforced DASH diet and exercise for weight loss - Scheduled follow-up in two weeks to reassess blood pressure control.     Procedure note: Discussed risk and benefits of I&D of this abscess including pain, scarring, bleeding or worsening infection Informed consent obtained verbally Area cleansed with Betadine x 3 Area numbed with 0.5 mL 2% lidocaine  with epi Area incised with #11 blade Small amount of pus and blood obtained Area cleansed with alcohol Area covered with triple antibiotic ointment and gauze Patient tolerated well, no complications    RTC in 2 weeks, follow-up HTN 3 months for your annual exam Angeline Laura, NP

## 2024-03-29 NOTE — Progress Notes (Signed)
 [facility] CONSULT NOTE  Patient Care Team: Antonette Angeline ORN, NP as PCP - General (Internal Medicine) Rennie Cindy SAUNDERS, MD as Consulting Physician (Oncology)  CHIEF COMPLAINTS/PURPOSE OF CONSULTATION: anemia  Oncology History   No history exists.    HISTORY OF PRESENTING ILLNESS: Patient ambulating- independently.   Alone/Accompanied by family.   Nemesis N Holeman 33 y.o.  female pleasant patient with a  Discussed the use of AI scribe software for clinical note transcription with the patient, who gave verbal consent to proceed.  History of Present Illness   Aby Gessel Bulls is a 33 year old female with anemia who presents for follow-up. She has been previously under the care of Dr. Brutus for her blood count issues.  She has been experiencing low blood counts, which required management by a hematologist. Her current hemoglobin level is 12.3. She received iron  infusions approximately six to seven months ago in March. She does not take oral iron  supplements due to gastrointestinal discomfort. Her menstrual cycles have been heavy but are reportedly improving.  She is dealing with an infection on her scalp, which was incised and drained earlier today by her primary doctor. She is on antibiotics for this infection.  Her blood pressure is elevated today, and her blood glucose level is 312. She started insulin therapy yesterday, which is new for her. She notes that infections can exacerbate her blood glucose levels.       Review of Systems  Constitutional:  Negative for chills, diaphoresis, fever, malaise/fatigue and weight loss.  HENT:  Negative for nosebleeds and sore throat.   Eyes:  Negative for double vision.  Respiratory:  Negative for cough, hemoptysis, sputum production, shortness of breath and wheezing.   Cardiovascular:  Negative for chest pain, palpitations, orthopnea and leg swelling.  Gastrointestinal:  Negative for abdominal pain, blood in stool,  constipation, diarrhea, heartburn, melena, nausea and vomiting.  Genitourinary:  Negative for dysuria, frequency and urgency.  Musculoskeletal:  Negative for back pain and joint pain.  Skin: Negative.  Negative for itching and rash.  Neurological:  Negative for dizziness, tingling, focal weakness, weakness and headaches.  Endo/Heme/Allergies:  Does not bruise/bleed easily.  Psychiatric/Behavioral:  Negative for depression. The patient is not nervous/anxious and does not have insomnia.     MEDICAL HISTORY:  Past Medical History:  Diagnosis Date   Anemia    Asthma    Diabetes mellitus without complication (HCC)     SURGICAL HISTORY: Past Surgical History:  Procedure Laterality Date   NO PAST SURGERIES      SOCIAL HISTORY: Social History   Socioeconomic History   Marital status: Married    Spouse name: Not on file   Number of children: Not on file   Years of education: Not on file   Highest education level: Not on file  Occupational History   Not on file  Tobacco Use   Smoking status: Every Day    Types: Cigarettes   Smokeless tobacco: Never  Vaping Use   Vaping status: Never Used  Substance and Sexual Activity   Alcohol use: Not Currently   Drug use: Never   Sexual activity: Not on file  Other Topics Concern   Not on file  Social History Narrative   Not on file   Social Drivers of Health   Financial Resource Strain: Not on file  Food Insecurity: No Food Insecurity (03/25/2024)   Hunger Vital Sign    Worried About Running Out of Food in the Last  Year: Never true    Ran Out of Food in the Last Year: Never true  Transportation Needs: No Transportation Needs (03/25/2024)   PRAPARE - Administrator, Civil Service (Medical): No    Lack of Transportation (Non-Medical): No  Physical Activity: Not on file  Stress: Not on file  Social Connections: Not on file  Intimate Partner Violence: Not At Risk (03/25/2024)   Humiliation, Afraid, Rape, and Kick  questionnaire    Fear of Current or Ex-Partner: No    Emotionally Abused: No    Physically Abused: No    Sexually Abused: No    FAMILY HISTORY: Family History  Problem Relation Age of Onset   Diabetes Mother    Parkinson's disease Mother    Irregular heart beat Mother    Diabetes Father    Healthy Sister    Diabetes Paternal Grandmother    Breast cancer Other    Colon cancer Neg Hx     ALLERGIES:  has no known allergies.  MEDICATIONS:  Current Outpatient Medications  Medication Sig Dispense Refill   amLODipine -olmesartan  (AZOR ) 10-40 MG tablet Take 1 tablet by mouth daily. 90 tablet 0   amoxicillin -clavulanate (AUGMENTIN) 875-125 MG tablet Take 1 tablet by mouth 2 (two) times daily for 5 days. 10 tablet 0   Blood Glucose Monitoring Suppl DEVI 1 each by Does not apply route as directed. Dispense based on patient and insurance preference. Use up to four times daily as directed. (FOR ICD-10 E10.9, E11.9). 1 each 0   Glucose Blood (BLOOD GLUCOSE TEST STRIPS) STRP 1 each by Does not apply route as directed. Dispense based on patient and insurance preference. Use up to four times daily as directed. (FOR ICD-10 E10.9, E11.9). 100 each 0   Insulin Pen Needle (PEN NEEDLES) 31G X 5 MM MISC 1 each by Does not apply route as directed. Dispense based on patient and insurance preference. Use up to four times daily as directed. (FOR ICD-10 E10.9, E11.9). 100 each 0   Lancet Device MISC 1 each by Does not apply route as directed. Dispense based on patient and insurance preference. Use up to four times daily as directed. (FOR ICD-10 E10.9, E11.9). 1 each 0   Lancets MISC 1 each by Does not apply route as directed. Dispense based on patient and insurance preference. Use up to four times daily as directed. (FOR ICD-10 E10.9, E11.9). 100 each 0   LANTUS SOLOSTAR 100 UNIT/ML Solostar Pen Inject into the skin.     oxyCODONE (OXY IR/ROXICODONE) 5 MG immediate release tablet Take by mouth.      sulfamethoxazole-trimethoprim (BACTRIM DS) 800-160 MG tablet Take by mouth.     VENTOLIN HFA 108 (90 Base) MCG/ACT inhaler Inhale 1-2 puffs into the lungs as needed for wheezing or shortness of breath.     ibuprofen  (ADVIL ) 200 MG tablet Take 1,200 mg by mouth daily as needed for headache, mild pain (pain score 1-3) or moderate pain (pain score 4-6). (Patient not taking: Reported on 03/29/2024)     No current facility-administered medications for this visit.    PHYSICAL EXAMINATION:   Vitals:   03/29/24 1437 03/29/24 1507  BP: (!) 135/106 (!) 145/99  Pulse: 90   Resp: 16   Temp: 98.7 F (37.1 C)   SpO2: 100%    Filed Weights   03/29/24 1437  Weight: (!) 376 lb 14.4 oz (171 kg)    Physical Exam Vitals and nursing note reviewed.  HENT:  Head: Normocephalic and atraumatic.     Mouth/Throat:     Pharynx: Oropharynx is clear.  Eyes:     Extraocular Movements: Extraocular movements intact.     Pupils: Pupils are equal, round, and reactive to light.  Cardiovascular:     Rate and Rhythm: Normal rate and regular rhythm.  Pulmonary:     Comments: Decreased breath sounds bilaterally.  Abdominal:     Palpations: Abdomen is soft.  Musculoskeletal:        General: Normal range of motion.     Cervical back: Normal range of motion.  Skin:    General: Skin is warm.  Neurological:     General: No focal deficit present.     Mental Status: She is alert and oriented to person, place, and time.  Psychiatric:        Behavior: Behavior normal.        Judgment: Judgment normal.     LABORATORY DATA:  I have reviewed the data as listed Lab Results  Component Value Date   WBC 7.2 03/29/2024   HGB 12.3 03/29/2024   HCT 37.7 03/29/2024   MCV 82.1 03/29/2024   PLT 336 03/29/2024   Recent Labs    04/02/23 1142 07/01/23 0924 03/24/24 1634 03/29/24 1452  NA 138 137 135 131*  K 4.0 4.6 4.0 4.0  CL 105 100 97* 95*  CO2 26 26 27 26   GLUCOSE 166* 244* 381* 357*  BUN 9 8 10 7    CREATININE 0.55 0.58 0.68 0.65  CALCIUM 8.8* 9.0 8.6* 8.4*  GFRNONAA >60  --  >60 >60  PROT 7.4 7.0  --  6.8  ALBUMIN 3.5  --   --  3.2*  AST 21 14  --  19  ALT 20 16  --  18  ALKPHOS 71  --   --  73  BILITOT <0.2 0.2  --  <0.2    RADIOGRAPHIC STUDIES: I have personally reviewed the radiological images as listed and agreed with the findings in the report. CT Maxillofacial W Contrast Result Date: 03/24/2024 EXAM: CT Face with contrast 03/24/2024 10:21:26 PM TECHNIQUE: CT of the face was performed with the administration of intravenous contrast. Multiplanar reformatted images are provided for review. Automated exposure control, iterative reconstruction, and/or weight based adjustment of the mA/kV was utilized to reduce the radiation dose to as low as reasonably achievable. COMPARISON: CT face 08/20/2021 CLINICAL HISTORY: Left facial swelling, eval abscess. FINDINGS: AERODIGESTIVE TRACT: No mass. No edema. SALIVARY GLANDS: No acute abnormality. LYMPH NODES: No suspicious cervical lymphadenopathy. SOFT TISSUES: Edema overlying the left maxilla and mandible. Small (4 x 4 mm) hypodensity along the left maxilla in this region (series 3 image 32) is suspicous for small abscess. Mildly prominent left upper neck lymph nodes. BRAIN, ORBITS AND SINUSES: Opacified right anterior ethmoid cells. Otherwise, common sinuses are clear. did BONES: No acute abnormality. No suspicious bone lesion. IMPRESSION: 1. Findings compatible with cellulitis overlying the left maxilla/mandible with suspected small 4 mm abscess along the maxilla. 2. Mildly prominent left upper neck lymph nodes, nonspecific but likely reactive given the above findings. Electronically signed by: Gilmore Molt MD 03/24/2024 10:43 PM EST RP Workstation: [id number]     Symptomatic anemia  Iron  deficiency anemia: Chronic since 2014.  Likely secondary to heavy menstrual cycles. Has previously seen OB/GYN.  -Intolerant to oral iron -constipation, upset  stomach.   # Hb today is 12.3- ok with venofer  today.    # Constipation: -New.  Took milk of  magnesium with mild improvement. -Advised to do MiraLAX once to twice a day as needed.  Sent prescription for Senokot 1-2 tabs daily as needed.    # Hypertension-on amlodipine  and olmesartan - recommend close monitoring of Blood pressures.    # Diabetes-poorly controolled-on insuin- on glipizide   # DISPOSITION: # venofer  today- ok with BP # follow up in 6 months- MD; labs- cbc/cmp; iron  studies;ferritin- possible venofer - Dr.B    Above plan of care was discussed with patient/family in detail.  My contact information was given to the patient/family.       Cindy JONELLE Joe, MD 03/29/2024 3:45 PM

## 2024-03-29 NOTE — Progress Notes (Signed)
 Fatigue/weakness: NO Dyspena: NO NO Blood in stool: NO   Being treated for an abscess, face/neck. Taking augmentin and bactrim ds.

## 2024-04-01 LAB — CULTURE,AEROBIC BACTERIA WITH GRAM STAIN
MICRO NUMBER:: 17225661
SPECIMEN QUALITY:: ADEQUATE

## 2024-04-03 ENCOUNTER — Ambulatory Visit: Payer: Self-pay | Admitting: Internal Medicine

## 2024-04-12 ENCOUNTER — Encounter: Payer: Self-pay | Admitting: Internal Medicine

## 2024-04-17 ENCOUNTER — Encounter: Payer: Self-pay | Admitting: Internal Medicine

## 2024-04-19 ENCOUNTER — Encounter: Payer: Self-pay | Admitting: Internal Medicine

## 2024-04-19 ENCOUNTER — Ambulatory Visit: Payer: Self-pay | Admitting: Internal Medicine

## 2024-04-19 VITALS — BP 138/88 | Ht 67.0 in | Wt 376.0 lb

## 2024-04-19 DIAGNOSIS — I10 Essential (primary) hypertension: Secondary | ICD-10-CM

## 2024-04-19 MED ORDER — AMLODIPINE-OLMESARTAN 10-40 MG PO TABS: 1.0000 | ORAL_TABLET | Freq: Every day | ORAL | 0 refills | Status: AC

## 2024-04-19 NOTE — Patient Instructions (Signed)
 Hypertension, Adult Hypertension is another name for high blood pressure. High blood pressure forces your heart to work harder to pump blood. This can cause problems over time. There are two numbers in a blood pressure reading. There is a top number (systolic) over a bottom number (diastolic). It is best to have a blood pressure that is below 120/80. What are the causes? The cause of this condition is not known. Some other conditions can lead to high blood pressure. What increases the risk? Some lifestyle factors can make you more likely to develop high blood pressure: Smoking. Not getting enough exercise or physical activity. Being overweight. Having too much fat, sugar, calories, or salt (sodium) in your diet. Drinking too much alcohol. Other risk factors include: Having any of these conditions: Heart disease. Diabetes. High cholesterol. Kidney disease. Obstructive sleep apnea. Having a family history of high blood pressure and high cholesterol. Age. The risk increases with age. Stress. What are the signs or symptoms? High blood pressure may not cause symptoms. Very high blood pressure (hypertensive crisis) may cause: Headache. Fast or uneven heartbeats (palpitations). Shortness of breath. Nosebleed. Vomiting or feeling like you may vomit (nauseous). Changes in how you see. Very bad chest pain. Feeling dizzy. Seizures. How is this treated? This condition is treated by making healthy lifestyle changes, such as: Eating healthy foods. Exercising more. Drinking less alcohol. Your doctor may prescribe medicine if lifestyle changes do not help enough and if: Your top number is above 130. Your bottom number is above 80. Your personal target blood pressure may vary. Follow these instructions at home: Eating and drinking  If told, follow the DASH eating plan. To follow this plan: Fill one half of your plate at each meal with fruits and vegetables. Fill one fourth of your plate  at each meal with whole grains. Whole grains include whole-wheat pasta, brown rice, and whole-grain bread. Eat or drink low-fat dairy products, such as skim milk or low-fat yogurt. Fill one fourth of your plate at each meal with low-fat (lean) proteins. Low-fat proteins include fish, chicken without skin, eggs, beans, and tofu. Avoid fatty meat, cured and processed meat, or chicken with skin. Avoid pre-made or processed food. Limit the amount of salt in your diet to less than 1,500 mg each day. Do not drink alcohol if: Your doctor tells you not to drink. You are pregnant, may be pregnant, or are planning to become pregnant. If you drink alcohol: Limit how much you have to: 0-1 drink a day for women. 0-2 drinks a day for men. Know how much alcohol is in your drink. In the [place] one drink equals one 12 oz bottle of beer (355 mL), one 5 oz glass of wine (148 mL), or one 1 oz glass of hard liquor (44 mL). Lifestyle  Work with your doctor to stay at a healthy weight or to lose weight. Ask your doctor what the best weight is for you. Get at least 30 minutes of exercise that causes your heart to beat faster (aerobic exercise) most days of the week. This may include walking, swimming, or biking. Get at least 30 minutes of exercise that strengthens your muscles (resistance exercise) at least 3 days a week. This may include lifting weights or doing Pilates. Do not smoke or use any products that contain nicotine or tobacco. If you need help quitting, ask your doctor. Check your blood pressure at home as told by your doctor. Keep all follow-up visits. Medicines Take over-the-counter and prescription medicines  only as told by your doctor. Follow directions carefully. Do not skip doses of blood pressure medicine. The medicine does not work as well if you skip doses. Skipping doses also puts you at risk for problems. Ask your doctor about side effects or reactions to medicines that you should watch  for. Contact a doctor if: You think you are having a reaction to the medicine you are taking. You have headaches that keep coming back. You feel dizzy. You have swelling in your ankles. You have trouble with your vision. Get help right away if: You get a very bad headache. You start to feel mixed up (confused). You feel weak or numb. You feel faint. You have very bad pain in your: Chest. Belly (abdomen). You vomit more than once. You have trouble breathing. These symptoms may be an emergency. Get help right away. Call 911. Do not wait to see if the symptoms will go away. Do not drive yourself to the hospital. Summary Hypertension is another name for high blood pressure. High blood pressure forces your heart to work harder to pump blood. For most people, a normal blood pressure is less than 120/80. Making healthy choices can help lower blood pressure. If your blood pressure does not get lower with healthy choices, you may need to take medicine. This information is not intended to replace advice given to you by your health care provider. Make sure you discuss any questions you have with your health care provider. Document Revised: 02/20/2021 Document Reviewed: 02/20/2021 Elsevier Patient Education  2024 [employer].

## 2024-04-19 NOTE — Progress Notes (Signed)
 Subjective:    Patient ID: Shalonda N Mefferd, female    DOB: 02-14-1991, 33 y.o.   MRN: [id number]  HPI   Discussed the use of AI scribe software for clinical note transcription with the patient, who gave verbal consent to proceed.   Gary N Leonardo is a 33 year old female with hypertension who presents for a two-week follow-up after restarting amlodipine -olmesartan .  She feels significantly better since restarting amlodipine -olmesartan  10-40 mg daily. Her blood pressure, previously in the 160s/100s range, has decreased to 134/90.    Review of Systems   Past Medical History:  Diagnosis Date   Anemia    Asthma    Diabetes mellitus without complication (HCC)     Current Outpatient Medications  Medication Sig Dispense Refill   amLODipine -olmesartan  (AZOR ) 10-40 MG tablet Take 1 tablet by mouth daily. 90 tablet 0   Blood Glucose Monitoring Suppl DEVI 1 each by Does not apply route as directed. Dispense based on patient and insurance preference. Use up to four times daily as directed. (FOR ICD-10 E10.9, E11.9). 1 each 0   Glucose Blood (BLOOD GLUCOSE TEST STRIPS) STRP 1 each by Does not apply route as directed. Dispense based on patient and insurance preference. Use up to four times daily as directed. (FOR ICD-10 E10.9, E11.9). 100 each 0   ibuprofen  (ADVIL ) 200 MG tablet Take 1,200 mg by mouth daily as needed for headache, mild pain (pain score 1-3) or moderate pain (pain score 4-6). (Patient not taking: Reported on 03/29/2024)     Insulin  Pen Needle (PEN NEEDLES) 31G X 5 MM MISC 1 each by Does not apply route as directed. Dispense based on patient and insurance preference. Use up to four times daily as directed. (FOR ICD-10 E10.9, E11.9). 100 each 0   Lancet Device MISC 1 each by Does not apply route as directed. Dispense based on patient and insurance preference. Use up to four times daily as directed. (FOR ICD-10 E10.9, E11.9). 1 each 0   Lancets MISC 1 each by Does not apply route as  directed. Dispense based on patient and insurance preference. Use up to four times daily as directed. (FOR ICD-10 E10.9, E11.9). 100 each 0   LANTUS  SOLOSTAR 100 UNIT/ML Solostar Pen Inject into the skin.     oxyCODONE (OXY IR/ROXICODONE) 5 MG immediate release tablet Take by mouth.     sulfamethoxazole-trimethoprim (BACTRIM DS) 800-160 MG tablet Take by mouth.     VENTOLIN  HFA 108 (90 Base) MCG/ACT inhaler Inhale 1-2 puffs into the lungs as needed for wheezing or shortness of breath.     No current facility-administered medications for this visit.    No Known Allergies  Family History  Problem Relation Age of Onset   Diabetes Mother    Parkinson's disease Mother    Irregular heart beat Mother    Diabetes Father    Healthy Sister    Diabetes Paternal Grandmother    Breast cancer Other    Colon cancer Neg Hx     Social History   Socioeconomic History   Marital status: Married    Spouse name: Not on file   Number of children: Not on file   Years of education: Not on file   Highest education level: Not on file  Occupational History   Not on file  Tobacco Use   Smoking status: Every Day    Types: Cigarettes   Smokeless tobacco: Never  Vaping Use   Vaping status: Never Used  Substance and  Sexual Activity   Alcohol use: Not Currently   Drug use: Never   Sexual activity: Not on file  Other Topics Concern   Not on file  Social History Narrative   Not on file   Social Drivers of Health   Financial Resource Strain: Not on file  Food Insecurity: No Food Insecurity (03/25/2024)   Hunger Vital Sign    Worried About Running Out of Food in the Last Year: Never true    Ran Out of Food in the Last Year: Never true  Transportation Needs: No Transportation Needs (03/25/2024)   PRAPARE - Administrator, Civil Service (Medical): No    Lack of Transportation (Non-Medical): No  Physical Activity: Not on file  Stress: Not on file  Social Connections: Not on file   Intimate Partner Violence: Not At Risk (03/25/2024)   Humiliation, Afraid, Rape, and Kick questionnaire    Fear of Current or Ex-Partner: No    Emotionally Abused: No    Physically Abused: No    Sexually Abused: No     Constitutional: Denies fever, malaise, fatigue, headache or abrupt weight changes.  HEENT:  Denies eye pain, eye redness, ear pain, ringing in the ears, wax buildup, runny nose, nasal congestion, bloody nose, or sore throat. Respiratory: Denies difficulty breathing, shortness of breath, cough or sputum production.   Cardiovascular: Denies chest pain, chest tightness, palpitations or swelling in the hands or feet.  Musculoskeletal: Denies decrease in range of motion, difficulty with gait, muscle pain or joint pain and swelling.  Skin: Denies redness, rashes, lesions or ulcercations.  Neurological: Patient reports insomnia.  Denies dizziness, difficulty with memory, difficulty with speech or problems with balance and coordination.    No other specific complaints in a complete review of systems (except as listed in HPI above).      Objective:  LMP 03/01/2024   BP 138/88   Ht 5' 7 (1.702 m)   Wt (!) 376 lb (170.6 kg)   LMP 04/01/2024 (Approximate)   BMI 58.89 kg/m   Wt Readings from Last 3 Encounters:  03/29/24 (!) 376 lb 14.4 oz (171 kg)  03/29/24 (!) 378 lb 8 oz (171.7 kg)  03/24/24 (!) 325 lb (147.4 kg)    General: Appears her stated age, obese in NAD. Cardiovascular: Normal rate and rhythm. S1,S2 noted.  No murmur, rubs or gallops noted. No JVD or BLE edema.  Pulmonary/Chest: Normal effort and positive vesicular breath sounds. No respiratory distress. No wheezes, rales or ronchi noted.  Musculoskeletal:  No difficulty with gait.  Neurological: Alert and oriented.    BMET    Component Value Date/Time   NA 131 (L) 03/29/2024 1452   NA 140 05/05/2014 1100   K 4.0 03/29/2024 1452   K 4.0 05/05/2014 1100   CL 95 (L) 03/29/2024 1452   CL 106 05/05/2014  1100   CO2 26 03/29/2024 1452   CO2 25 05/05/2014 1100   GLUCOSE 357 (H) 03/29/2024 1452   GLUCOSE 117 (H) 05/05/2014 1100   BUN 7 03/29/2024 1452   BUN 8 05/05/2014 1100   CREATININE 0.65 03/29/2024 1452   CREATININE 0.58 07/01/2023 0924   CALCIUM 8.4 (L) 03/29/2024 1452   CALCIUM 8.4 (L) 05/05/2014 1100   GFRNONAA >60 03/29/2024 1452   GFRNONAA >60 05/05/2014 1100   GFRNONAA >60 11/09/2013 1854   GFRAA >60 05/05/2014 1100   GFRAA >60 11/09/2013 1854    Lipid Panel     Component Value Date/Time  CHOL 150 07/01/2023 0924   TRIG 71 07/01/2023 0924   HDL 47 (L) 07/01/2023 0924   CHOLHDL 3.2 07/01/2023 0924   LDLCALC 87 07/01/2023 0924    CBC    Component Value Date/Time   WBC 7.2 03/29/2024 1452   WBC 9.2 03/24/2024 1634   RBC 4.59 03/29/2024 1452   HGB 12.3 03/29/2024 1452   HGB 9.8 (L) 05/05/2014 1100   HCT 37.7 03/29/2024 1452   HCT 31.2 (L) 05/05/2014 1100   PLT 336 03/29/2024 1452   PLT 455 (H) 05/05/2014 1100   MCV 82.1 03/29/2024 1452   MCV 69 (L) 05/05/2014 1100   MCH 26.8 03/29/2024 1452   MCHC 32.6 03/29/2024 1452   RDW 13.9 03/29/2024 1452   RDW 20.4 (H) 05/05/2014 1100   LYMPHSABS 2.1 03/29/2024 1452   LYMPHSABS 1.9 11/09/2013 1854   MONOABS 0.5 03/29/2024 1452   MONOABS 0.5 11/09/2013 1854   EOSABS 0.7 (H) 03/29/2024 1452   EOSABS 0.6 11/09/2013 1854   BASOSABS 0.0 03/29/2024 1452   BASOSABS 0.1 11/09/2013 1854    Hgb A1C Lab Results  Component Value Date   HGBA1C 12.5 (H) 03/24/2024            Assessment & Plan:   Assessment and Plan    Hypertension Complicated by morbid obesity blood pressure improved with amlodipine  but not at target. Additional medication may be needed. Emphasized lifestyle modifications. - Continue amlodipine  10/40 mg daily. - Encouraged weight loss and reduced salt intake.    RTC in 2  months for your annual exam Angeline Laura, NP

## 2024-06-28 ENCOUNTER — Encounter: Payer: Self-pay | Admitting: Internal Medicine

## 2024-09-27 ENCOUNTER — Inpatient Hospital Stay: Admitting: Internal Medicine

## 2024-09-27 ENCOUNTER — Inpatient Hospital Stay
